# Patient Record
Sex: Male | Born: 1980 | Marital: Married | Smoking: Former smoker
Health system: Southern US, Community
[De-identification: ages and names within clinical notes are randomized; demographics above are authoritative.]

## PROBLEM LIST (undated history)

## (undated) DIAGNOSIS — Z946 Bone transplant status: Secondary | ICD-10-CM

## (undated) DIAGNOSIS — J40 Bronchitis, not specified as acute or chronic: Secondary | ICD-10-CM

## (undated) DIAGNOSIS — K859 Acute pancreatitis without necrosis or infection, unspecified: Secondary | ICD-10-CM

## (undated) DIAGNOSIS — D571 Sickle-cell disease without crisis: Secondary | ICD-10-CM

---

## 2000-12-12 ENCOUNTER — Encounter: Payer: Self-pay | Admitting: Emergency Medicine

## 2000-12-12 ENCOUNTER — Emergency Department (HOSPITAL_COMMUNITY): Admission: EM | Admit: 2000-12-12 | Discharge: 2000-12-12 | Payer: Self-pay | Admitting: Emergency Medicine

## 2001-06-15 ENCOUNTER — Emergency Department (HOSPITAL_COMMUNITY): Admission: EM | Admit: 2001-06-15 | Discharge: 2001-06-15 | Payer: Self-pay | Admitting: *Deleted

## 2002-12-05 ENCOUNTER — Emergency Department (HOSPITAL_COMMUNITY): Admission: EM | Admit: 2002-12-05 | Discharge: 2002-12-05 | Payer: Self-pay | Admitting: Emergency Medicine

## 2002-12-13 ENCOUNTER — Encounter: Payer: Self-pay | Admitting: Emergency Medicine

## 2002-12-13 ENCOUNTER — Emergency Department (HOSPITAL_COMMUNITY): Admission: EM | Admit: 2002-12-13 | Discharge: 2002-12-13 | Payer: Self-pay | Admitting: Emergency Medicine

## 2003-01-13 ENCOUNTER — Encounter: Payer: Self-pay | Admitting: *Deleted

## 2003-01-13 ENCOUNTER — Emergency Department (HOSPITAL_COMMUNITY): Admission: EM | Admit: 2003-01-13 | Discharge: 2003-01-13 | Payer: Self-pay | Admitting: Emergency Medicine

## 2003-11-13 ENCOUNTER — Emergency Department (HOSPITAL_COMMUNITY): Admission: EM | Admit: 2003-11-13 | Discharge: 2003-11-13 | Payer: Self-pay | Admitting: Emergency Medicine

## 2012-02-19 ENCOUNTER — Encounter (HOSPITAL_COMMUNITY): Payer: Self-pay

## 2012-02-19 ENCOUNTER — Emergency Department (INDEPENDENT_AMBULATORY_CARE_PROVIDER_SITE_OTHER)
Admission: EM | Admit: 2012-02-19 | Discharge: 2012-02-19 | Disposition: A | Discharge status: Home / Self Care | Payer: Self-pay | Source: Home / Self Care | Attending: Emergency Medicine | Admitting: Emergency Medicine

## 2012-02-19 DIAGNOSIS — J029 Acute pharyngitis, unspecified: Secondary | ICD-10-CM

## 2012-02-19 LAB — POCT RAPID STREP A: Streptococcus, Group A Screen (Direct): NEGATIVE

## 2012-02-19 MED ORDER — ACETAMINOPHEN-CODEINE 120-12 MG/5ML PO SUSP: 10.0000 mL | Freq: Four times a day (QID) | ORAL | Status: AC | PRN

## 2012-02-19 NOTE — ED Provider Notes (Signed)
History     CSN: [id number]  Arrival date & time 02/19/12  1704   First MD Initiated Contact with Patient 02/19/12 1709      Chief Complaint  Patient presents with  . Sore Throat    (Consider location/radiation/quality/duration/timing/severity/associated sxs/prior treatment) HPI Comments: Pt states that he has had no fever or other cold symptoms  Patient is a 31 y.o. male presenting with pharyngitis. The history is provided by the patient. No language interpreter was used.  Sore Throat This is a new problem. The current episode started yesterday. The problem occurs constantly. The problem has not changed since onset.Pertinent negatives include no headaches. The symptoms are aggravated by swallowing. Nothing relieves the symptoms. He has tried nothing for the symptoms.    History reviewed. No pertinent past medical history.  History reviewed. No pertinent past surgical history.  History reviewed. No pertinent family history.  History  Substance Use Topics  . Smoking status: Current Everyday Smoker  . Smokeless tobacco: Not on file  . Alcohol Use: No      Review of Systems  Constitutional: Negative.   HENT: Positive for sore throat.   Respiratory: Negative.   Cardiovascular: Negative.   Neurological: Negative for headaches.    Allergies  Eggs or egg-derived products; Iodine; and Peanut-containing drug products  Home Medications  No current outpatient prescriptions on file.  BP 127/70  Pulse 70  Temp 98.4 F (36.9 C) (Oral)  Resp 16  SpO2 100%  Physical Exam  Nursing note and vitals reviewed. Constitutional: He appears well-developed and well-nourished.  HENT:  Right Ear: External ear normal.  Left Ear: External ear normal.  Mouth/Throat: Posterior oropharyngeal erythema present. No oropharyngeal exudate.  Cardiovascular: Normal rate and regular rhythm.   Pulmonary/Chest: Effort normal and breath sounds normal.    ED Course  Procedures (including  critical care time)   Labs Reviewed  POCT RAPID STREP A ([facility] ONLY)   No results found.   1. Pharyngitis       MDM  Will treat symptomatically:don't think pt needs antibiotics at this time      Rhona Leavens, NP 02/19/12 1847

## 2012-02-19 NOTE — ED Provider Notes (Signed)
Medical screening examination/treatment/procedure(s) were performed by non-physician practitioner and as supervising physician I was immediately available for consultation/collaboration.  Haleem Hanner   Bonne Whack, MD 02/19/12 2027 

## 2012-02-19 NOTE — ED Notes (Signed)
Pt has sore throat that started yesterday, difficulty swallowing.

## 2012-07-19 ENCOUNTER — Emergency Department (HOSPITAL_COMMUNITY)
Admission: EM | Admit: 2012-07-19 | Discharge: 2012-07-20 | Disposition: A | Discharge status: Home / Self Care | Payer: Self-pay | Attending: Emergency Medicine | Admitting: Emergency Medicine

## 2012-07-19 ENCOUNTER — Emergency Department (HOSPITAL_COMMUNITY): Payer: Self-pay

## 2012-07-19 ENCOUNTER — Encounter (HOSPITAL_COMMUNITY): Payer: Self-pay | Admitting: Emergency Medicine

## 2012-07-19 DIAGNOSIS — R630 Anorexia: Secondary | ICD-10-CM | POA: Insufficient documentation

## 2012-07-19 DIAGNOSIS — R52 Pain, unspecified: Secondary | ICD-10-CM | POA: Insufficient documentation

## 2012-07-19 DIAGNOSIS — R0789 Other chest pain: Secondary | ICD-10-CM | POA: Insufficient documentation

## 2012-07-19 DIAGNOSIS — J9801 Acute bronchospasm: Secondary | ICD-10-CM | POA: Insufficient documentation

## 2012-07-19 DIAGNOSIS — F17200 Nicotine dependence, unspecified, uncomplicated: Secondary | ICD-10-CM | POA: Insufficient documentation

## 2012-07-19 DIAGNOSIS — R112 Nausea with vomiting, unspecified: Secondary | ICD-10-CM | POA: Insufficient documentation

## 2012-07-19 DIAGNOSIS — R509 Fever, unspecified: Secondary | ICD-10-CM | POA: Insufficient documentation

## 2012-07-19 DIAGNOSIS — R0602 Shortness of breath: Secondary | ICD-10-CM | POA: Insufficient documentation

## 2012-07-19 DIAGNOSIS — J029 Acute pharyngitis, unspecified: Secondary | ICD-10-CM | POA: Insufficient documentation

## 2012-07-19 DIAGNOSIS — D571 Sickle-cell disease without crisis: Secondary | ICD-10-CM | POA: Insufficient documentation

## 2012-07-19 DIAGNOSIS — J40 Bronchitis, not specified as acute or chronic: Secondary | ICD-10-CM | POA: Insufficient documentation

## 2012-07-19 DIAGNOSIS — R002 Palpitations: Secondary | ICD-10-CM | POA: Insufficient documentation

## 2012-07-19 DIAGNOSIS — R51 Headache: Secondary | ICD-10-CM | POA: Insufficient documentation

## 2012-07-19 HISTORY — DX: Sickle-cell disease without crisis: D57.1

## 2012-07-19 LAB — CBC WITH DIFFERENTIAL/PLATELET
Eosinophils Relative: 1 % (ref 0–5)
HCT: 40.5 % (ref 39.0–52.0)
Hemoglobin: 13.6 g/dL (ref 13.0–17.0)
Lymphocytes Relative: 5 % — ABNORMAL LOW (ref 12–46)
Lymphs Abs: 0.9 10*3/uL (ref 0.7–4.0)
MCV: 85.6 fL (ref 78.0–100.0)
Monocytes Absolute: 1.5 10*3/uL — ABNORMAL HIGH (ref 0.1–1.0)
Monocytes Relative: 9 % (ref 3–12)
Neutro Abs: 14.6 10*3/uL — ABNORMAL HIGH (ref 1.7–7.7)
WBC: 17.1 10*3/uL — ABNORMAL HIGH (ref 4.0–10.5)

## 2012-07-19 LAB — BASIC METABOLIC PANEL
BUN: 5 mg/dL — ABNORMAL LOW (ref 6–23)
CO2: 25 mEq/L (ref 19–32)
Calcium: 8.9 mg/dL (ref 8.4–10.5)
Chloride: 100 mEq/L (ref 96–112)
Creatinine, Ser: 0.97 mg/dL (ref 0.50–1.35)
Glucose, Bld: 120 mg/dL — ABNORMAL HIGH (ref 70–99)

## 2012-07-19 MED ORDER — IBUPROFEN 800 MG PO TABS
800.0000 mg | ORAL_TABLET | Freq: Once | ORAL | Status: AC
  Administered 2012-07-19: 800 mg via ORAL
  Filled 2012-07-19: qty 1

## 2012-07-19 MED ORDER — ALBUTEROL SULFATE (5 MG/ML) 0.5% IN NEBU
2.5000 mg | INHALATION_SOLUTION | Freq: Once | RESPIRATORY_TRACT | Status: AC
  Administered 2012-07-19: 2.5 mg via RESPIRATORY_TRACT
  Filled 2012-07-19: qty 0.5

## 2012-07-19 NOTE — ED Notes (Signed)
ZOX:[id number]<AV> Expected date:07/19/12<BR> Expected time: 9:10 PM<BR> Means of arrival:Ambulance<BR> Comments:<BR> Flu like sxs

## 2012-07-19 NOTE — ED Provider Notes (Signed)
History     CSN: [id number]  Arrival date & time 07/19/12  2126   First MD Initiated Contact with Patient 07/19/12 2216      Chief Complaint  Patient presents with  . Cough  . Nausea  . Emesis  . Pain    (Consider location/radiation/quality/duration/timing/severity/associated sxs/prior treatment) HPI Comments: 31 year old male presents to the emergency department complaining of not feeling well since Sunday. States he has a productive cough with dark green phlegm. Admits to associated fever of 101, chills, generalized body aches and decreased appetite, chest tightness, shortness of breath and sore throat. Mucinex and Benadryl provide some relief. Despite triage summary, patient denies nausea, vomiting or diarrhea. No sick contacts.  Patient is a 31 y.o. male presenting with cough and vomiting. The history is provided by the patient and a significant other.  Cough Associated symptoms include chills, headaches, sore throat and shortness of breath.  Emesis  Associated symptoms include arthralgias, chills, cough, a fever and headaches.    Past Medical History  Diagnosis Date  . Sickle cell anemia     History reviewed. No pertinent past surgical history.  History reviewed. No pertinent family history.  History  Substance Use Topics  . Smoking status: Current Every Day Smoker  . Smokeless tobacco: Not on file  . Alcohol Use: No      Review of Systems  Constitutional: Positive for fever, chills and appetite change.  HENT: Positive for sore throat.   Respiratory: Positive for cough, chest tightness and shortness of breath.   Cardiovascular: Positive for palpitations.  Gastrointestinal: Negative for nausea and vomiting.  Musculoskeletal: Positive for arthralgias.  Neurological: Positive for headaches.  All other systems reviewed and are negative.    Allergies  Eggs or egg-derived products; Iodine; and Peanut-containing drug products  Home Medications   Current  Outpatient Rx  Name  Route  Sig  Dispense  Refill  . DIPHENHYDRAMINE HCL (SLEEP) 25 MG PO TABS   Oral   Take 50 mg by mouth 2 (two) times daily as needed. allergies         . GUAIFENESIN 100 MG/5ML PO LIQD   Oral   Take 400 mg by mouth once.           BP 140/78  Temp 100.7 F (38.2 C) (Oral)  Resp 10  SpO2 100%  Physical Exam  Nursing note and vitals reviewed. Constitutional: He is oriented to person, place, and time. He appears well-developed and well-nourished. No distress.       Appears fatigued, laying in bed shivering.  HENT:  Head: Normocephalic and atraumatic.  Mouth/Throat: Uvula is midline and mucous membranes are normal. Posterior oropharyngeal erythema present. No posterior oropharyngeal edema.  Eyes: Conjunctivae normal are normal. No scleral icterus.  Neck: Normal range of motion. Neck supple.  Cardiovascular: Regular rhythm, normal heart sounds and intact distal pulses.  Tachycardia present.   Pulmonary/Chest: Effort normal. He has wheezes (scattered posterior lower lung fields). He has rhonchi (scattered posterior lower lung fields, R>L).  Abdominal: Soft. Bowel sounds are normal. There is generalized tenderness. There is no guarding.  Musculoskeletal: Normal range of motion. He exhibits no edema.  Lymphadenopathy:    He has no cervical adenopathy.  Neurological: He is alert and oriented to person, place, and time.  Skin: Skin is warm.       Very warm.  Psychiatric: He has a normal mood and affect. His behavior is normal.    ED Course  Procedures (including critical care  time)  Labs Reviewed  CBC WITH DIFFERENTIAL - Abnormal; Notable for the following:    WBC 17.1 (*)     Neutrophils Relative 85 (*)     Neutro Abs 14.6 (*)     Lymphocytes Relative 5 (*)     Monocytes Absolute 1.5 (*)     All other components within normal limits  BASIC METABOLIC PANEL - Abnormal; Notable for the following:    Sodium 134 (*)     Glucose, Bld 120 (*)     BUN 5 (*)      All other components within normal limits   Dg Chest 2 View  07/19/2012  *RADIOLOGY REPORT*  Clinical Data: Cough and nausea; sickle cell disease  CHEST - 2 VIEW  Comparison: None.  Findings: The lungs are clear.  Heart size and pulmonary vascularity are normal.  No adenopathy.  No bone lesions are appreciable.  IMPRESSION: No edema or consolidation.   Original Report Authenticated By: Bretta Bang, M.D.      1. Bronchitis   2. Bronchospasm       MDM  31 y/o male with productive cough, ronchi, wheezes, fever. CXR normal. Albuterol breathing treatment provided some relief for patient. Wheezing improved. Discharged with albuterol inhaler and zithromax for bronchitis/bronchospasm. Return precautions discussed. Infection care and precautions discussed. Stable for discharge.        Trevor Mace, PA-C 07/20/12 [phone]

## 2012-07-19 NOTE — ED Notes (Signed)
Pt c/oN/V/D x 3days with poor appetite generalized body aches non productive cough lungs clear

## 2012-07-20 MED ORDER — ALBUTEROL SULFATE HFA 108 (90 BASE) MCG/ACT IN AERS
2.0000 | INHALATION_SPRAY | Freq: Once | RESPIRATORY_TRACT | Status: AC
  Administered 2012-07-20: 2 via RESPIRATORY_TRACT
  Filled 2012-07-20: qty 6.7

## 2012-07-20 MED ORDER — AZITHROMYCIN 250 MG PO TABS
ORAL_TABLET | ORAL | Status: AC
  Filled 2012-07-20: qty 1

## 2012-07-20 MED ORDER — AZITHROMYCIN 250 MG PO TABS: 250.0000 mg | ORAL_TABLET | Freq: Every day | ORAL | Status: DC

## 2012-07-20 MED ORDER — AZITHROMYCIN 250 MG PO TABS
500.0000 mg | ORAL_TABLET | Freq: Once | ORAL | Status: AC
  Administered 2012-07-20: 500 mg via ORAL
  Filled 2012-07-20: qty 1

## 2012-07-21 ENCOUNTER — Emergency Department (HOSPITAL_COMMUNITY)
Admission: EM | Admit: 2012-07-21 | Discharge: 2012-07-21 | Disposition: A | Discharge status: Home / Self Care | Payer: Self-pay | Attending: Emergency Medicine | Admitting: Emergency Medicine

## 2012-07-21 ENCOUNTER — Encounter (HOSPITAL_COMMUNITY): Payer: Self-pay | Admitting: Emergency Medicine

## 2012-07-21 DIAGNOSIS — R630 Anorexia: Secondary | ICD-10-CM | POA: Insufficient documentation

## 2012-07-21 DIAGNOSIS — R1013 Epigastric pain: Secondary | ICD-10-CM | POA: Insufficient documentation

## 2012-07-21 DIAGNOSIS — D571 Sickle-cell disease without crisis: Secondary | ICD-10-CM | POA: Insufficient documentation

## 2012-07-21 DIAGNOSIS — R109 Unspecified abdominal pain: Secondary | ICD-10-CM

## 2012-07-21 DIAGNOSIS — Z8719 Personal history of other diseases of the digestive system: Secondary | ICD-10-CM | POA: Insufficient documentation

## 2012-07-21 DIAGNOSIS — Z79899 Other long term (current) drug therapy: Secondary | ICD-10-CM | POA: Insufficient documentation

## 2012-07-21 DIAGNOSIS — Z8709 Personal history of other diseases of the respiratory system: Secondary | ICD-10-CM | POA: Insufficient documentation

## 2012-07-21 HISTORY — DX: Bronchitis, not specified as acute or chronic: J40

## 2012-07-21 LAB — CBC WITH DIFFERENTIAL/PLATELET
Basophils Absolute: 0 K/uL (ref 0.0–0.1)
Basophils Relative: 0 % (ref 0–1)
Eosinophils Absolute: 0.1 K/uL (ref 0.0–0.7)
Eosinophils Relative: 1 % (ref 0–5)
HCT: 45.5 % (ref 39.0–52.0)
Hemoglobin: 15.3 g/dL (ref 13.0–17.0)
Lymphocytes Relative: 7 % — ABNORMAL LOW (ref 12–46)
Lymphs Abs: 0.8 K/uL (ref 0.7–4.0)
MCH: 29.2 pg (ref 26.0–34.0)
MCHC: 33.6 g/dL (ref 30.0–36.0)
MCV: 86.8 fL (ref 78.0–100.0)
Monocytes Absolute: 1.3 K/uL — ABNORMAL HIGH (ref 0.1–1.0)
Monocytes Relative: 11 % (ref 3–12)
Neutro Abs: 9.4 K/uL — ABNORMAL HIGH (ref 1.7–7.7)
Neutrophils Relative %: 81 % — ABNORMAL HIGH (ref 43–77)
Platelets: 243 K/uL (ref 150–400)
RBC: 5.24 MIL/uL (ref 4.22–5.81)
RDW: 13.8 % (ref 11.5–15.5)
WBC: 11.7 K/uL — ABNORMAL HIGH (ref 4.0–10.5)

## 2012-07-21 LAB — COMPREHENSIVE METABOLIC PANEL WITH GFR
ALT: 16 U/L (ref 0–53)
AST: 26 U/L (ref 0–37)
Albumin: 4.2 g/dL (ref 3.5–5.2)
Alkaline Phosphatase: 66 U/L (ref 39–117)
BUN: 11 mg/dL (ref 6–23)
CO2: 27 meq/L (ref 19–32)
Calcium: 9.7 mg/dL (ref 8.4–10.5)
Chloride: 99 meq/L (ref 96–112)
Creatinine, Ser: 1.04 mg/dL (ref 0.50–1.35)
GFR calc Af Amer: >90 mL/min (ref >90)
GFR calc non Af Amer: >90 mL/min (ref >90)
Glucose, Bld: 94 mg/dL (ref 70–99)
Potassium: 4.1 meq/L (ref 3.5–5.1)
Sodium: 138 meq/L (ref 135–145)
Total Bilirubin: 0.5 mg/dL (ref 0.3–1.2)
Total Protein: 8.5 g/dL — ABNORMAL HIGH (ref 6.0–8.3)

## 2012-07-21 MED ORDER — OXYCODONE-ACETAMINOPHEN 5-325 MG PO TABS: 1.0000 | ORAL_TABLET | Freq: Four times a day (QID) | ORAL | Status: DC | PRN

## 2012-07-21 MED ORDER — SODIUM CHLORIDE 0.9 % IV BOLUS (SEPSIS)
1000.0000 mL | Freq: Once | INTRAVENOUS | Status: AC
  Administered 2012-07-21: 1000 mL via INTRAVENOUS

## 2012-07-21 MED ORDER — HYDROMORPHONE HCL PF 1 MG/ML IJ SOLN
1.0000 mg | Freq: Once | INTRAMUSCULAR | Status: AC
  Administered 2012-07-21: 1 mg via INTRAVENOUS
  Filled 2012-07-21: qty 1

## 2012-07-21 MED ORDER — ONDANSETRON HCL 4 MG/2ML IJ SOLN
4.0000 mg | Freq: Once | INTRAMUSCULAR | Status: AC
  Administered 2012-07-21: 4 mg via INTRAVENOUS
  Filled 2012-07-21: qty 2

## 2012-07-21 MED ORDER — PROMETHAZINE HCL 25 MG PO TABS: 25.0000 mg | ORAL_TABLET | Freq: Four times a day (QID) | ORAL | Status: DC | PRN

## 2012-07-21 NOTE — ED Provider Notes (Signed)
History     CSN: [id number]  Arrival date & time 07/21/12  7829   First MD Initiated Contact with Patient 07/21/12 [phone]      Chief Complaint  Patient presents with  . Abdominal Pain    (Consider location/radiation/quality/duration/timing/severity/associated sxs/prior treatment) HPI... sharp epigastric pain for several days. Patient has history of pancreatitis. Has been drinking alcohol lately.  Decreased oral intake. Alcohol makes symptoms worse. Severity is moderate. No radiation of pain.  Past Medical History  Diagnosis Date  . Sickle cell anemia   . Bronchitis     History reviewed. No pertinent past surgical history.  History reviewed. No pertinent family history.  History  Substance Use Topics  . Smoking status: Never Smoker   . Smokeless tobacco: Not on file  . Alcohol Use: 7.2 oz/week    12 Cans of beer per week     Comment: Pint of liquor a day      Review of Systems  All other systems reviewed and are negative.    Allergies  Eggs or egg-derived products; Iodine; and Peanut-containing drug products  Home Medications   Current Outpatient Rx  Name  Route  Sig  Dispense  Refill  . ALBUTEROL SULFATE HFA 108 (90 BASE) MCG/ACT IN AERS   Inhalation   Inhale 2 puffs into the lungs every 6 (six) hours as needed. For shortness of breath         . AZITHROMYCIN 250 MG PO TABS   Oral   Take 250 mg by mouth daily.         Marland Kitchen DIPHENHYDRAMINE HCL (SLEEP) 25 MG PO TABS   Oral   Take 50 mg by mouth 2 (two) times daily as needed. allergies         . GUAIFENESIN ER 600 MG PO TB12   Oral   Take 1,200 mg by mouth 2 (two) times daily.         . GUAIFENESIN 100 MG/5ML PO LIQD   Oral   Take 400 mg by mouth once.         . OXYCODONE-ACETAMINOPHEN 5-325 MG PO TABS   Oral   Take 1-2 tablets by mouth every 6 (six) hours as needed for pain.   15 tablet   0   . PROMETHAZINE HCL 25 MG PO TABS   Oral   Take 1 tablet (25 mg total) by mouth every 6 (six)  hours as needed for nausea.   15 tablet   0     BP 116/69  Pulse 81  Temp 97.5 F (36.4 C) (Oral)  Resp 20  SpO2 100%  Physical Exam  Nursing note and vitals reviewed. Constitutional: He is oriented to person, place, and time. He appears well-developed and well-nourished.  HENT:  Head: Normocephalic and atraumatic.  Eyes: Conjunctivae normal and EOM are normal. Pupils are equal, round, and reactive to light.  Neck: Normal range of motion. Neck supple.  Cardiovascular: Normal rate, regular rhythm and normal heart sounds.   Pulmonary/Chest: Effort normal and breath sounds normal.  Abdominal: Soft. Bowel sounds are normal.       Minimal epigastric tenderness  Musculoskeletal: Normal range of motion.  Neurological: He is alert and oriented to person, place, and time.  Skin: Skin is warm and dry.  Psychiatric: He has a normal mood and affect.    ED Course  Procedures (including critical care time)  Labs Reviewed  CBC WITH DIFFERENTIAL - Abnormal; Notable for the following:    WBC  11.7 (*)     Neutrophils Relative 81 (*)     Neutro Abs 9.4 (*)     Lymphocytes Relative 7 (*)     Monocytes Absolute 1.3 (*)     All other components within normal limits  COMPREHENSIVE METABOLIC PANEL - Abnormal; Notable for the following:    Total Protein 8.5 (*)     All other components within normal limits  LIPASE, BLOOD   Dg Chest 2 View  07/19/2012  *RADIOLOGY REPORT*  Clinical Data: Cough and nausea; sickle cell disease  CHEST - 2 VIEW  Comparison: None.  Findings: The lungs are clear.  Heart size and pulmonary vascularity are normal.  No adenopathy.  No bone lesions are appreciable.  IMPRESSION: No edema or consolidation.   Original Report Authenticated By: Bretta Bang, M.D.      1. Abdominal pain       MDM  Lipase is normal. No acute abdomen on physical exam. patient feeling better after hydration and pain management.  Discharge home on Percocet #15 and Phenergan 25  mg#15        Donnetta Hutching, MD 07/21/12 [phone]

## 2012-07-21 NOTE — ED Notes (Addendum)
Pt c/o mid abd pain describes it as a squeezing feeling. Rates pain a 8/10. Pt states he went to [place] [facility] 2 days ago for the same complaint and was diagnosed with bronchitis. Pt denies n/v but states he has had diarrhea x 1 in last 24hrs. State he has been unable to eat solid food since Sunday. Pt was prescribed zithromax at [facility]. Pt states he has been drinking a pint of alcohol a day since he was released from jail on the 29th and drinking a 12 pack of beer.

## 2012-07-21 NOTE — ED Provider Notes (Signed)
Medical screening examination/treatment/procedure(s) were performed by non-physician practitioner and as supervising physician I was immediately available for consultation/collaboration.  Itha Kroeker T Halana Deisher, MD 07/21/12 1544 

## 2012-07-21 NOTE — ED Notes (Signed)
Pt states "I have pancreatitis".

## 2012-07-23 ENCOUNTER — Encounter (HOSPITAL_COMMUNITY): Payer: Self-pay | Admitting: *Deleted

## 2012-07-23 ENCOUNTER — Emergency Department (HOSPITAL_COMMUNITY): Payer: Self-pay

## 2012-07-23 ENCOUNTER — Emergency Department (HOSPITAL_COMMUNITY)
Admission: EM | Admit: 2012-07-23 | Discharge: 2012-07-23 | Disposition: A | Discharge status: Home / Self Care | Payer: Self-pay | Attending: Emergency Medicine | Admitting: Emergency Medicine

## 2012-07-23 DIAGNOSIS — K5900 Constipation, unspecified: Secondary | ICD-10-CM | POA: Insufficient documentation

## 2012-07-23 DIAGNOSIS — R110 Nausea: Secondary | ICD-10-CM | POA: Insufficient documentation

## 2012-07-23 DIAGNOSIS — D571 Sickle-cell disease without crisis: Secondary | ICD-10-CM | POA: Insufficient documentation

## 2012-07-23 DIAGNOSIS — R12 Heartburn: Secondary | ICD-10-CM | POA: Insufficient documentation

## 2012-07-23 DIAGNOSIS — K2970 Gastritis, unspecified, without bleeding: Secondary | ICD-10-CM | POA: Insufficient documentation

## 2012-07-23 DIAGNOSIS — Z79899 Other long term (current) drug therapy: Secondary | ICD-10-CM | POA: Insufficient documentation

## 2012-07-23 LAB — COMPREHENSIVE METABOLIC PANEL
ALT: 13 U/L (ref 0–53)
Albumin: 3.7 g/dL (ref 3.5–5.2)
Alkaline Phosphatase: 46 U/L (ref 39–117)
Chloride: 102 mEq/L (ref 96–112)
Potassium: 3.9 mEq/L (ref 3.5–5.1)
Sodium: 139 mEq/L (ref 135–145)
Total Protein: 7.4 g/dL (ref 6.0–8.3)

## 2012-07-23 MED ORDER — MORPHINE SULFATE 4 MG/ML IJ SOLN
4.0000 mg | Freq: Once | INTRAMUSCULAR | Status: AC
  Administered 2012-07-23: 4 mg via INTRAVENOUS
  Filled 2012-07-23: qty 1

## 2012-07-23 MED ORDER — PANTOPRAZOLE SODIUM 20 MG PO TBEC: 20.0000 mg | DELAYED_RELEASE_TABLET | Freq: Every day | ORAL | Status: DC

## 2012-07-23 MED ORDER — PANTOPRAZOLE SODIUM 40 MG IV SOLR
40.0000 mg | Freq: Once | INTRAVENOUS | Status: AC
  Administered 2012-07-23: 40 mg via INTRAVENOUS
  Filled 2012-07-23: qty 40

## 2012-07-23 MED ORDER — FAMOTIDINE 20 MG PO TABS: 20.0000 mg | ORAL_TABLET | Freq: Two times a day (BID) | ORAL | Status: DC

## 2012-07-23 MED ORDER — ONDANSETRON HCL 4 MG/2ML IJ SOLN
4.0000 mg | Freq: Once | INTRAMUSCULAR | Status: AC
  Administered 2012-07-23: 4 mg via INTRAVENOUS
  Filled 2012-07-23: qty 2

## 2012-07-23 MED ORDER — HYDROMORPHONE HCL PF 1 MG/ML IJ SOLN
1.0000 mg | Freq: Once | INTRAMUSCULAR | Status: AC
  Administered 2012-07-23: 1 mg via INTRAVENOUS
  Filled 2012-07-23: qty 1

## 2012-07-23 NOTE — ED Provider Notes (Signed)
Medical screening examination/treatment/procedure(s) were conducted as a shared visit with non-physician practitioner(s) and myself.  I personally evaluated the patient during the encounter   Joya Gaskins, MD 07/23/12 [phone]

## 2012-07-23 NOTE — ED Provider Notes (Signed)
History     CSN: [id number]  Arrival date & time 07/23/12  4098   First MD Initiated Contact with Patient 07/23/12 [phone]      Chief Complaint  Patient presents with  . Abdominal Pain    Patient is a 31 y.o. male presenting with abdominal pain. The history is provided by the patient.  Abdominal Pain The primary symptoms of the illness include abdominal pain and nausea. The primary symptoms of the illness do not include fever, fatigue, shortness of breath, vomiting, diarrhea, hematemesis, hematochezia or dysuria. The current episode started more than 2 days ago. The onset of the illness was sudden. The problem has been gradually worsening.  The illness is associated with alcohol use and eating. Additional symptoms associated with the illness include heartburn and constipation. Symptoms associated with the illness do not include back pain.  pt reports epigastric pain for over a week Reports it started after drinking ETOH but has not had ETOH in past several days Reports worse with eating No fever No cp/sob No dysuria, no groin/testicle pain reported No blood/dark stools Reports no BM in past 2 days  He reports he has sickle cell trait, not anemia  Past Medical History  Diagnosis Date  . Sickle cell anemia   . Bronchitis     History reviewed. No pertinent past surgical history.  History reviewed. No pertinent family history.  History  Substance Use Topics  . Smoking status: Never Smoker   . Smokeless tobacco: Not on file  . Alcohol Use: 7.2 oz/week    12 Cans of beer per week     Comment: Pint of liquor a day      Review of Systems  Constitutional: Negative for fever and fatigue.  Respiratory: Negative for shortness of breath.   Gastrointestinal: Positive for heartburn, nausea, abdominal pain and constipation. Negative for vomiting, diarrhea, hematochezia and hematemesis.  Genitourinary: Negative for dysuria.  Musculoskeletal: Negative for back pain.  All other  systems reviewed and are negative.    Allergies  Eggs or egg-derived products; Iodine; and Peanut-containing drug products  Home Medications   Current Outpatient Rx  Name  Route  Sig  Dispense  Refill  . ALBUTEROL SULFATE HFA 108 (90 BASE) MCG/ACT IN AERS   Inhalation   Inhale 2 puffs into the lungs every 6 (six) hours as needed. For shortness of breath         . AZITHROMYCIN 250 MG PO TABS   Oral   Take 250 mg by mouth daily.         Marland Kitchen DIPHENHYDRAMINE HCL (SLEEP) 25 MG PO TABS   Oral   Take 50 mg by mouth 2 (two) times daily as needed. allergies         . GUAIFENESIN ER 600 MG PO TB12   Oral   Take 1,200 mg by mouth 2 (two) times daily.         . GUAIFENESIN 100 MG/5ML PO LIQD   Oral   Take 400 mg by mouth once.         . OXYCODONE-ACETAMINOPHEN 5-325 MG PO TABS   Oral   Take 1-2 tablets by mouth every 6 (six) hours as needed. For pain         . PROMETHAZINE HCL 25 MG PO TABS   Oral   Take 25 mg by mouth every 6 (six) hours as needed. For nausea           BP 149/80  Pulse 54  Temp 99 F (37.2 C) (Oral)  Resp 18  SpO2 100%  Physical Exam CONSTITUTIONAL: Well developed/well nourished HEAD AND FACE: Normocephalic/atraumatic EYES: EOMI/PERRL, no scleral icterus noted ENMT: Mucous membranes moist NECK: supple no meningeal signs SPINE:entire spine nontender CV: S1/S2 noted, no murmurs/rubs/gallops noted LUNGS: Lungs are clear to auscultation bilaterally, no apparent distress ABDOMEN: soft, moderate epigastric tenderness, no rebound or guarding GU:no cva tenderness Rectal - stool color normal NEURO: Pt is awake/alert, moves all extremitiesx4 EXTREMITIES: pulses normal, full ROM SKIN: warm, color normal PSYCH: no abnormalities of mood noted  ED Course  Procedures    Labs Reviewed  COMPREHENSIVE METABOLIC PANEL  LIPASE, BLOOD   9:01 AM Pt with recent ED visits and evaluations, now with persistent abdominal pain.  He has tenderness in  epigastric region.  Will recheck lft/lipase and obtain US abdomen Pt agreeable Advised that if workup today is negative, he will be safe for discharge and f/u with GI I doubt appendicitis at this time Will need f/u with GI if [place] negative   MDM  Nursing notes including past medical history and social history reviewed and considered in documentation Previous records reviewed and considered Labs/vital reviewed and considered         Joya Gaskins, MD 07/23/12 1022

## 2012-07-23 NOTE — ED Notes (Signed)
Pt reports abd pain that occurs after he eats and food is digesting. Having constipation, last bm was two days ago.

## 2012-07-23 NOTE — ED Provider Notes (Signed)
  Physical Exam  BP 149/80  Pulse 54  Temp 99 F (37.2 C) (Oral)  Resp 18  SpO2 100%  Physical Exam  On examination he appeared uncomfortable but nontoxic. Vital signs as documented. Skin warm and dry and without overt rashes. Neck without JVD. Lungs clear. Heart exam notable for regular rhythm, normal sounds and absence of murmurs, rubs or gallops. Abdomen with epigastric tenderness on palpation without guarding or rebound tenderness. No hernia noted. No overlying skin changes. Negative Murphy's sign, no McBurney's point. Extremities nonedematous.  ED Course  Procedures  MDM  31 year old male presents with abdominal pain. He was moved to CDU while waiting for abdominal ultrasound. He has been evaluated by Dr. Bebe Shaggy. Per Dr. Bebe Shaggy, pt's pain likely reflect gastritis from alcohol use.  Alcohol cessation were discussed.  Will obtain abd [place].  If neg, treats for gastritis with PPI/H2.  F/u with GI.    11:35 AM abd US shows no acute abnormality.  Result were discussed with patient.  Pt continues to endorse epigastric abd pain, unrelieved with initial treatment.  Unable to tolerates PO at this time.  Will continue pain management.  Otherwise, VSS.    12:45 PM Pt able to tolerates PO. Still endorse epigastric pain.  I recommend close f/u with GI.  Will prescribe protonix and pepcid.   BP 149/80  Pulse 54  Temp 99 F (37.2 C) (Oral)  Resp 18  SpO2 100%  I have reviewed nursing notes and vital signs. I personally reviewed the imaging tests through PACS system  I reviewed available ER/hospitalization records thought the EMR   Fayrene Helper, [place] 07/23/12 1246

## 2013-08-22 ENCOUNTER — Encounter (HOSPITAL_COMMUNITY): Payer: Self-pay | Admitting: Emergency Medicine

## 2013-08-22 ENCOUNTER — Emergency Department (INDEPENDENT_AMBULATORY_CARE_PROVIDER_SITE_OTHER)
Admission: EM | Admit: 2013-08-22 | Discharge: 2013-08-22 | Disposition: A | Discharge status: Home / Self Care | Payer: Self-pay | Source: Home / Self Care

## 2013-08-22 DIAGNOSIS — X58XXXA Exposure to other specified factors, initial encounter: Secondary | ICD-10-CM

## 2013-08-22 DIAGNOSIS — S01311A Laceration without foreign body of right ear, initial encounter: Secondary | ICD-10-CM

## 2013-08-22 DIAGNOSIS — S01309A Unspecified open wound of unspecified ear, initial encounter: Secondary | ICD-10-CM

## 2013-08-22 DIAGNOSIS — Z23 Encounter for immunization: Secondary | ICD-10-CM

## 2013-08-22 MED ORDER — ACETAMINOPHEN-CODEINE #3 300-30 MG PO TABS: 1.0000 | ORAL_TABLET | Freq: Four times a day (QID) | ORAL | Status: DC | PRN

## 2013-08-22 MED ORDER — TRAMADOL HCL 50 MG PO TABS: 50.0000 mg | ORAL_TABLET | Freq: Four times a day (QID) | ORAL | Status: DC | PRN

## 2013-08-22 MED ORDER — TETANUS-DIPHTHERIA TOXOIDS TD 5-2 LFU IM INJ
0.5000 mL | INJECTION | Freq: Once | INTRAMUSCULAR | Status: AC
  Administered 2013-08-22: 0.5 mL via INTRAMUSCULAR

## 2013-08-22 MED ORDER — AMOXICILLIN-POT CLAVULANATE 875-125 MG PO TABS: 1.0000 | ORAL_TABLET | Freq: Two times a day (BID) | ORAL | Status: DC

## 2013-08-22 MED ORDER — TETANUS-DIPHTH-ACELL PERTUSSIS 5-2.5-18.5 LF-MCG/0.5 IM SUSP
INTRAMUSCULAR | Status: AC
  Filled 2013-08-22: qty 0.5

## 2013-08-22 NOTE — ED Notes (Signed)
C/o facial laceration States he got bite on the ear

## 2013-08-22 NOTE — Discharge Instructions (Signed)
Auricle Injuries You have an injury to your external ear (auricle). The ear has a layer of skin over cartilage. A cut or bruise to the ear can separate the skin from the cartilage underneath. This can cause problems with healing if blood gathers between the skin and the cartilage. Permanent damage to the ear may result if the excess blood is not drained within 1 to 2 days. Stitches, tape, or tissue glue may be used to close a cut. A pressure bandage may be used to keep blood from forming under the injured skin. If there is a lot of blood present (hematoma), a needle aspiration may be needed to remove it. You must have the ear checked within 1 to 2 days or as directed if you have had this type of injury. This is see if the blood has accumulated again. Call your caregiver for a follow-up exam as recommended.  SEEK IMMEDIATE MEDICAL CARE IF:  You develop severe pain.  You develop a fever or pus like drainage.  You have increased hearing loss or other problems. MAKE SURE YOU:   Understand these instructions.  Will watch your condition.  Will get help right away if you are not doing well or get worse. Document Released: 07/26/2005 Document Revised: 10/18/2011 Document Reviewed: 01/12/2007 [facility] Patient Information 2014 [place], [place].  Laceration Care, Adult A laceration is a cut or lesion that goes through all layers of the skin and into the tissue just beneath the skin. TREATMENT  Some lacerations may not require closure. Some lacerations may not be able to be closed due to an increased risk of infection. It is important to see your caregiver as soon as possible after an injury to minimize the risk of infection and maximize the opportunity for successful closure. If closure is appropriate, pain medicines may be given, if needed. The wound will be cleaned to help prevent infection. Your caregiver will use stitches (sutures), staples, wound glue (adhesive), or skin adhesive strips to repair  the laceration. These tools bring the skin edges together to allow for faster healing and a better cosmetic outcome. However, all wounds will heal with a scar. Once the wound has healed, scarring can be minimized by covering the wound with sunscreen during the day for 1 full year. HOME CARE INSTRUCTIONS  For sutures or staples:  Keep the wound clean and dry.  If you were given a bandage (dressing), you should change it at least once a day. Also, change the dressing if it becomes wet or dirty, or as directed by your caregiver.  Wash the wound with soap and water 2 times a day. Rinse the wound off with water to remove all soap. Pat the wound dry with a clean towel.  After cleaning, apply a thin layer of the antibiotic ointment as recommended by your caregiver. This will help prevent infection and keep the dressing from sticking.  You may shower as usual after the first 24 hours. Do not soak the wound in water until the sutures are removed.  Only take over-the-counter or prescription medicines for pain, discomfort, or fever as directed by your caregiver.  Get your sutures or staples removed as directed by your caregiver. For skin adhesive strips:  Keep the wound clean and dry.  Do not get the skin adhesive strips wet. You may bathe carefully, using caution to keep the wound dry.  If the wound gets wet, pat it dry with a clean towel.  Skin adhesive strips will fall off on their  own. You may trim the strips as the wound heals. Do not remove skin adhesive strips that are still stuck to the wound. They will fall off in time. For wound adhesive:  You may briefly wet your wound in the shower or bath. Do not soak or scrub the wound. Do not swim. Avoid periods of heavy perspiration until the skin adhesive has fallen off on its own. After showering or bathing, gently pat the wound dry with a clean towel.  Do not apply liquid medicine, cream medicine, or ointment medicine to your wound while the  skin adhesive is in place. This may loosen the film before your wound is healed.  If a dressing is placed over the wound, be careful not to apply tape directly over the skin adhesive. This may cause the adhesive to be pulled off before the wound is healed.  Avoid prolonged exposure to sunlight or tanning lamps while the skin adhesive is in place. Exposure to ultraviolet light in the first year will darken the scar.  The skin adhesive will usually remain in place for 5 to 10 days, then naturally fall off the skin. Do not pick at the adhesive film. You may need a tetanus shot if:  You cannot remember when you had your last tetanus shot.  You have never had a tetanus shot. If you get a tetanus shot, your arm may swell, get red, and feel warm to the touch. This is common and not a problem. If you need a tetanus shot and you choose not to have one, there is a rare chance of getting tetanus. Sickness from tetanus can be serious. SEEK MEDICAL CARE IF:   You have redness, swelling, or increasing pain in the wound.  You see a red line that goes away from the wound.  You have yellowish-white fluid (pus) coming from the wound.  You have a fever.  You notice a bad smell coming from the wound or dressing.  Your wound breaks open before or after sutures have been removed.  You notice something coming out of the wound such as wood or glass.  Your wound is on your hand or foot and you cannot move a finger or toe. SEEK IMMEDIATE MEDICAL CARE IF:   Your pain is not controlled with prescribed medicine.  You have severe swelling around the wound causing pain and numbness or a change in color in your arm, hand, leg, or foot.  Your wound splits open and starts bleeding.  You have worsening numbness, weakness, or loss of function of any joint around or beyond the wound.  You develop painful lumps near the wound or on the skin anywhere on your body. MAKE SURE YOU:   Understand these  instructions.  Will watch your condition.  Will get help right away if you are not doing well or get worse. Document Released: 07/26/2005 Document Revised: 10/18/2011 Document Reviewed: 01/19/2011 [facility] Patient Information 2014 [place], [place].  Sutured Wound Care Sutures are stitches that can be used to close wounds. Wound care helps prevent pain and infection.  HOME CARE INSTRUCTIONS   Rest and elevate the injured area until all the pain and swelling are gone.  Only take over-the-counter or prescription medicines for pain, discomfort, or fever as directed by your caregiver.  After 48 hours, gently wash the area with mild soap and water once a day, or as directed. Rinse off the soap. Pat the area dry with a clean towel. Do not rub the wound. This may  cause bleeding.  Follow your caregiver's instructions for how often to change the bandage (dressing). Stop using a dressing after 2 days or after the wound stops draining.  If the dressing sticks, moisten it with soapy water and gently remove it.  Apply ointment on the wound as directed.  Avoid stretching a sutured wound.  Drink enough fluids to keep your urine clear or pale yellow.  Follow up with your caregiver for suture removal as directed.  Use sunscreen on your wound for the next 3 to 6 months so the scar will not darken. SEEK IMMEDIATE MEDICAL CARE IF:   Your wound becomes red, swollen, hot, or tender.  You have increasing pain in the wound.  You have a red streak that extends from the wound.  There is pus coming from the wound.  You have a fever.  You have shaking chills.  There is a bad smell coming from the wound.  You have persistent bleeding from the wound. MAKE SURE YOU:   Understand these instructions.  Will watch your condition.  Will get help right away if you are not doing well or get worse. Document Released: 09/02/2004 Document Revised: 10/18/2011 Document Reviewed: 11/29/2010 [facility]  Patient Information 2014 [place].

## 2013-08-22 NOTE — ED Provider Notes (Signed)
Medical screening examination/treatment/procedure(s) were performed by non-physician practitioner and as supervising physician I was immediately available for consultation/collaboration.  Leslee Homeavid Surena Welge, M.D.  Reuben Likesavid C Mikaela Hilgeman, MD 08/22/13 [phone]
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
## 2017-05-26 ENCOUNTER — Encounter (HOSPITAL_COMMUNITY): Payer: Self-pay | Admitting: Emergency Medicine

## 2017-05-26 DIAGNOSIS — Z91010 Allergy to peanuts: Secondary | ICD-10-CM | POA: Insufficient documentation

## 2017-05-26 DIAGNOSIS — Z79899 Other long term (current) drug therapy: Secondary | ICD-10-CM | POA: Insufficient documentation

## 2017-05-26 DIAGNOSIS — D571 Sickle-cell disease without crisis: Secondary | ICD-10-CM | POA: Insufficient documentation

## 2017-05-26 DIAGNOSIS — J019 Acute sinusitis, unspecified: Secondary | ICD-10-CM | POA: Insufficient documentation

## 2017-05-26 NOTE — ED Triage Notes (Signed)
Pt reports cold symptoms for a month but no relief. Endorses headache and nasal congestion. Denies sore throat, N/V. Able to tolerate food but has loss of appetite.

## 2017-05-27 ENCOUNTER — Emergency Department (HOSPITAL_COMMUNITY)
Admission: EM | Admit: 2017-05-27 | Discharge: 2017-05-27 | Disposition: A | Discharge status: Home / Self Care | Payer: Self-pay | Attending: Emergency Medicine | Admitting: Emergency Medicine

## 2017-05-27 DIAGNOSIS — J019 Acute sinusitis, unspecified: Secondary | ICD-10-CM

## 2017-05-27 MED ORDER — BENZONATATE 100 MG PO CAPS: 100.0000 mg | ORAL_CAPSULE | Freq: Three times a day (TID) | ORAL | 0 refills | Status: DC | PRN

## 2017-05-27 MED ORDER — ALBUTEROL SULFATE HFA 108 (90 BASE) MCG/ACT IN AERS
2.0000 | INHALATION_SPRAY | Freq: Four times a day (QID) | RESPIRATORY_TRACT | Status: DC | PRN
Start: 2017-05-27 — End: 2017-05-27
  Administered 2017-05-27: 2 via RESPIRATORY_TRACT
  Filled 2017-05-27: qty 6.7

## 2017-05-27 MED ORDER — FLUTICASONE PROPIONATE 50 MCG/ACT NA SUSP
2.0000 | Freq: Every day | NASAL | Status: DC
  Administered 2017-05-27: 2 via NASAL
  Filled 2017-05-27: qty 16

## 2017-05-27 MED ORDER — AMOXICILLIN-POT CLAVULANATE 875-125 MG PO TABS: 1.0000 | ORAL_TABLET | Freq: Two times a day (BID) | ORAL | 0 refills | Status: DC

## 2017-05-27 MED ORDER — BENZONATATE 100 MG PO CAPS
200.0000 mg | ORAL_CAPSULE | Freq: Once | ORAL | Status: AC
  Administered 2017-05-27: 200 mg via ORAL
  Filled 2017-05-27: qty 2

## 2017-05-27 MED ORDER — IBUPROFEN 600 MG PO TABS: 600.0000 mg | ORAL_TABLET | Freq: Four times a day (QID) | ORAL | 0 refills | Status: DC | PRN

## 2017-05-27 MED ORDER — AMOXICILLIN-POT CLAVULANATE 875-125 MG PO TABS
1.0000 | ORAL_TABLET | Freq: Once | ORAL | Status: AC
  Administered 2017-05-27: 1 via ORAL
  Filled 2017-05-27: qty 1

## 2017-05-27 MED ORDER — NAPROXEN 500 MG PO TABS
500.0000 mg | ORAL_TABLET | Freq: Once | ORAL | Status: AC
  Administered 2017-05-27: 500 mg via ORAL
  Filled 2017-05-27: qty 1

## 2017-05-27 NOTE — ED Notes (Signed)
Pt ambulated to bathroom & back to room 

## 2017-05-27 NOTE — ED Provider Notes (Signed)
[facility] EMERGENCY DEPARTMENT Provider Note   CSN: [id number] Arrival date & time: 05/26/17  2216    History   Chief Complaint Chief Complaint  Patient presents with  . URI    HPI Dustin Franklin is a 36 y.o. male.  The history is provided by the patient. No language interpreter was used.  URI   This is a new problem. The current episode started more than 1 week ago. The problem has not changed since onset.Maximum temperature: subjective during 1st week of symptoms. Associated symptoms include diarrhea, congestion, rhinorrhea and cough. Pertinent negatives include no vomiting, no sore throat, no rash and no wheezing. Treatments tried: Alka Seltzer, Benadryl, Tylenol. The treatment provided no relief.    Past Medical History:  Diagnosis Date  . Bronchitis   . Sickle cell anemia (HCC)     There are no active problems to display for this patient.   History reviewed. No pertinent surgical history.    Home Medications    Prior to Admission medications   Medication Sig Start Date End Date Taking? Authorizing Provider  acetaminophen-codeine (TYLENOL #3) 300-30 MG per tablet Take 1-2 tablets by mouth every 6 (six) hours as needed for moderate pain. 08/22/13   Hayden Rasmussen, NP  albuterol (PROVENTIL HFA;VENTOLIN HFA) 108 (90 BASE) MCG/ACT inhaler Inhale 2 puffs into the lungs every 6 (six) hours as needed. For shortness of breath    [provider]  amoxicillin-clavulanate (AUGMENTIN) 875-125 MG tablet Take 1 tablet by mouth every 12 (twelve) hours. 05/27/17   Antony Madura, PA-C  azithromycin (ZITHROMAX) 250 MG tablet Take 250 mg by mouth daily. 07/20/12   Hess, Nada Boozer, PA-C  benzonatate (TESSALON) 100 MG capsule Take 1-2 capsules (100-200 mg total) by mouth 3 (three) times daily as needed for cough. 05/27/17   Antony Madura, PA-C  diphenhydrAMINE (SOMINEX) 25 MG tablet Take 50 mg by mouth 2 (two) times daily as needed. allergies    [provider]  famotidine (PEPCID) 20 MG tablet Take 1 tablet (20 mg total) by mouth 2 (two) times daily. 07/23/12   Fayrene Helper, PA-C  guaiFENesin (MUCINEX) 600 MG 12 hr tablet Take 1,200 mg by mouth 2 (two) times daily.    [provider]  guaiFENesin (ROBITUSSIN) 100 MG/5ML liquid Take 400 mg by mouth once.    [provider]  ibuprofen (ADVIL,MOTRIN) 600 MG tablet Take 1 tablet (600 mg total) by mouth every 6 (six) hours as needed. 05/27/17   Antony Madura, PA-C  oxyCODONE-acetaminophen (PERCOCET/ROXICET) 5-325 MG per tablet Take 1-2 tablets by mouth every 6 (six) hours as needed. For pain 07/21/12   Donnetta Hutching, MD  pantoprazole (PROTONIX) 20 MG tablet Take 1 tablet (20 mg total) by mouth daily. 07/23/12   Fayrene Helper, PA-C  promethazine (PHENERGAN) 25 MG tablet Take 25 mg by mouth every 6 (six) hours as needed. For nausea 07/21/12   Donnetta Hutching, MD    Family History No family history on file.  Social History Social History  Substance Use Topics  . Smoking status: Never Smoker  . Smokeless tobacco: Never Used  . Alcohol use 7.2 oz/week    12 Cans of beer per week     Comment: Pint of liquor a day     Allergies   Eggs or egg-derived products; Iodine; and Peanut-containing drug products   Review of Systems Review of Systems  HENT: Positive for congestion and rhinorrhea. Negative for sore throat.   Respiratory: Positive  for cough. Negative for wheezing.   Gastrointestinal: Positive for diarrhea. Negative for vomiting.  Skin: Negative for rash.  Ten systems reviewed and are negative for acute change, except as noted in the HPI.    Physical Exam Updated Vital Signs BP 126/75 (BP Location: Left Arm)   Pulse 64   Temp 98.4 F (36.9 C) (Oral)   Resp 18   SpO2 92%   Physical Exam  Constitutional: He is oriented to person, place, and time. He appears well-developed and well-nourished. No distress.  Nontoxic appearing and in NAD. Playing candy crush.    HENT:  Head: Normocephalic and atraumatic.  Audible nasal congestion. Patient tolerating secretions without difficulty. No tripoding or stridor.  Eyes: Conjunctivae and EOM are normal. No scleral icterus.  Neck: Normal range of motion.  No meningismus  Cardiovascular: Normal rate, regular rhythm and intact distal pulses.   Pulmonary/Chest: Effort normal. No respiratory distress. He has no wheezes. He has no rales.  Lungs CTAB. Respirations even and unlabored. Sporadic dry, nonproductive cough noted.  Musculoskeletal: Normal range of motion.  Neurological: He is alert and oriented to person, place, and time. He exhibits normal muscle tone. Coordination normal.  Ambulatory with steady gait.  Skin: Skin is warm and dry. No rash noted. He is not diaphoretic. No erythema. No pallor.  Psychiatric: He has a normal mood and affect. His behavior is normal.  Nursing note and vitals reviewed.    ED Treatments / Results  Labs (all labs ordered are listed, but only abnormal results are displayed) Labs Reviewed - No data to display  EKG  EKG Interpretation None       Radiology No results found.  Procedures Procedures (including critical care time)  Medications Ordered in ED Medications  fluticasone (FLONASE) 50 MCG/ACT nasal spray 2 spray (2 sprays Each Nare Given 05/27/17 0536)  albuterol (PROVENTIL HFA;VENTOLIN HFA) 108 (90 Base) MCG/ACT inhaler 2 puff (2 puffs Inhalation Given 05/27/17 0536)  amoxicillin-clavulanate (AUGMENTIN) 875-125 MG per tablet 1 tablet (1 tablet Oral Given 05/27/17 0534)  benzonatate (TESSALON) capsule 200 mg (200 mg Oral Given 05/27/17 0535)  naproxen (NAPROSYN) tablet 500 mg (500 mg Oral Given 05/27/17 0535)     Initial Impression / Assessment and Plan / ED Course  I have reviewed the triage vital signs and the nursing notes.  Pertinent labs & imaging results that were available during my care of the patient were reviewed by me and considered in my  medical decision making (see chart for details).      Patient complaining of moderate symptoms of nasal discharge/congestion with cough for ~1 month. Patient is afebrile with reassuring vital signs. No hypoxia, tachypnea, dyspnea; lungs CTAB. Doubt PNA. Given chronicity of symptoms and hx of sickle cell, will start on Augmentin in addition to symptomatic medications for treatment of sinusitis. Return precautions discussed and provided. Patient discharged in stable condition with no unaddressed concerns.   Final Clinical Impressions(s) / ED Diagnoses   Final diagnoses:  Acute sinusitis, recurrence not specified, unspecified location    New Prescriptions New Prescriptions   AMOXICILLIN-CLAVULANATE (AUGMENTIN) 875-125 MG TABLET    Take 1 tablet by mouth every 12 (twelve) hours.   BENZONATATE (TESSALON) 100 MG CAPSULE    Take 1-2 capsules (100-200 mg total) by mouth 3 (three) times daily as needed for cough.   IBUPROFEN (ADVIL,MOTRIN) 600 MG TABLET    Take 1 tablet (600 mg total) by mouth every 6 (six) hours as needed.     Jobe Gibbon,  Tresa EndoKelly, PA-C 05/27/17 0554    Dione BoozeGlick, David, MD 05/27/17 [phone]

## 2017-05-27 NOTE — Discharge Instructions (Signed)
Take Augmentin as prescribed until finished. You may use 2 sprays of Flonase in each nostril daily for congestion. Used 2 puffs of an albuterol inhaler every 6 hours as needed for shortness of breath and cough. You may also take Tessalon for cough as prescribed. Take ibuprofen for discomfort/pain. Follow-up with a primary care doctor.

## 2017-05-27 NOTE — ED Notes (Signed)
PA at bedside.

## 2017-05-27 NOTE — ED Notes (Signed)
Pt. alert & interactive during discharge; pt. ambulatory to exit 

## 2017-07-04 ENCOUNTER — Encounter (HOSPITAL_COMMUNITY): Payer: Self-pay

## 2017-07-04 ENCOUNTER — Emergency Department (HOSPITAL_COMMUNITY)
Admission: EM | Admit: 2017-07-04 | Discharge: 2017-07-04 | Disposition: A | Discharge status: Home / Self Care | Payer: Self-pay | Attending: Physician Assistant | Admitting: Physician Assistant

## 2017-07-04 ENCOUNTER — Other Ambulatory Visit: Payer: Self-pay

## 2017-07-04 ENCOUNTER — Emergency Department (HOSPITAL_COMMUNITY): Payer: Self-pay

## 2017-07-04 DIAGNOSIS — Z79899 Other long term (current) drug therapy: Secondary | ICD-10-CM | POA: Insufficient documentation

## 2017-07-04 DIAGNOSIS — M545 Low back pain, unspecified: Secondary | ICD-10-CM

## 2017-07-04 DIAGNOSIS — Z91010 Allergy to peanuts: Secondary | ICD-10-CM | POA: Insufficient documentation

## 2017-07-04 HISTORY — DX: Acute pancreatitis without necrosis or infection, unspecified: K85.90

## 2017-07-04 LAB — URINALYSIS, ROUTINE W REFLEX MICROSCOPIC
BILIRUBIN URINE: NEGATIVE
Glucose, UA: NEGATIVE mg/dL
Hgb urine dipstick: NEGATIVE
KETONES UR: NEGATIVE mg/dL
LEUKOCYTES UA: NEGATIVE
Nitrite: NEGATIVE
PH: 7 (ref 5.0–8.0)
Protein, ur: NEGATIVE mg/dL
Specific Gravity, Urine: 1.018 (ref 1.005–1.030)

## 2017-07-04 LAB — CBC
HCT: 44.8 % (ref 39.0–52.0)
HEMOGLOBIN: 15.1 g/dL (ref 13.0–17.0)
MCH: 29.7 pg (ref 26.0–34.0)
MCHC: 33.7 g/dL (ref 30.0–36.0)
MCV: 88.2 fL (ref 78.0–100.0)
Platelets: 195 10*3/uL (ref 150–400)
RBC: 5.08 MIL/uL (ref 4.22–5.81)
RDW: 13.9 % (ref 11.5–15.5)
WBC: 6.4 10*3/uL (ref 4.0–10.5)

## 2017-07-04 LAB — BASIC METABOLIC PANEL
ANION GAP: 6 (ref 5–15)
BUN: 9 mg/dL (ref 6–20)
CALCIUM: 8.8 mg/dL — AB (ref 8.9–10.3)
CO2: 25 mmol/L (ref 22–32)
Chloride: 108 mmol/L (ref 101–111)
Creatinine, Ser: 0.84 mg/dL (ref 0.61–1.24)
GFR calc Af Amer: >60 mL/min (ref >60)
GFR calc non Af Amer: >60 mL/min (ref >60)
GLUCOSE: 97 mg/dL (ref 65–99)
Potassium: 4.3 mmol/L (ref 3.5–5.1)
Sodium: 139 mmol/L (ref 135–145)

## 2017-07-04 MED ORDER — HYDROCODONE-ACETAMINOPHEN 5-325 MG PO TABS: 1.0000 | ORAL_TABLET | Freq: Four times a day (QID) | ORAL | 0 refills | Status: DC | PRN

## 2017-07-04 MED ORDER — METHOCARBAMOL 500 MG PO TABS: 500.0000 mg | ORAL_TABLET | Freq: Two times a day (BID) | ORAL | 0 refills | Status: DC

## 2017-07-04 MED ORDER — HYDROCODONE-ACETAMINOPHEN 5-325 MG PO TABS
2.0000 | ORAL_TABLET | Freq: Once | ORAL | Status: AC
  Administered 2017-07-04: 2 via ORAL
  Filled 2017-07-04: qty 2

## 2017-07-04 MED ORDER — IBUPROFEN 600 MG PO TABS: 600.0000 mg | ORAL_TABLET | Freq: Four times a day (QID) | ORAL | 0 refills | Status: DC | PRN

## 2017-07-04 MED ORDER — KETOROLAC TROMETHAMINE 60 MG/2ML IM SOLN
60.0000 mg | Freq: Once | INTRAMUSCULAR | Status: AC
  Administered 2017-07-04: 60 mg via INTRAMUSCULAR
  Filled 2017-07-04: qty 2

## 2017-07-04 MED ORDER — LIDOCAINE 5 % EX PTCH
1.0000 | MEDICATED_PATCH | Freq: Once | CUTANEOUS | Status: DC
  Administered 2017-07-04: 1 via TRANSDERMAL
  Filled 2017-07-04: qty 1

## 2017-07-04 MED ORDER — LIDOCAINE 5 % EX PTCH: 1.0000 | MEDICATED_PATCH | CUTANEOUS | 0 refills | Status: DC

## 2017-07-04 MED ORDER — METHOCARBAMOL 500 MG PO TABS
1000.0000 mg | ORAL_TABLET | Freq: Once | ORAL | Status: AC
  Administered 2017-07-04: 1000 mg via ORAL
  Filled 2017-07-04: qty 2

## 2017-07-04 NOTE — ED Provider Notes (Signed)
[facility]   CSN: [id number] Arrival date & time: 07/04/17  [id number]     History   Chief Complaint Chief Complaint  Patient presents with  . Flank Pain    HPI Dustin Franklin is a 36 y.o. male.  HPI  Dustin Franklin is a 36 y.o. male, with a history of pancreatitis and sickle cell, presenting to the ED with left lower back pain that came on yesterday. Twisted getting out of the car and began to have this pain.  Pain is sharp/sore, 10/10, nonradiating.  Denies fever/chills, abdominal pain, N/V/C/D, pain with BMs, dysuria, hematuria, SOB, CP, neuro deficits, or any other complaints.   Past Medical History:  Diagnosis Date  . Bronchitis   . Pancreatitis   . Sickle cell anemia (HCC)     There are no active problems to display for this patient.   History reviewed. No pertinent surgical history.     Home Medications    Prior to Admission medications   Medication Sig Start Date End Date Taking? Authorizing Provider  acetaminophen (TYLENOL) 325 MG tablet Take 650 mg by mouth every 6 (six) hours as needed for mild pain.   Yes [provider]  albuterol (PROVENTIL HFA;VENTOLIN HFA) 108 (90 BASE) MCG/ACT inhaler Inhale 2 puffs into the lungs every 6 (six) hours as needed. For shortness of breath   Yes [provider]  ferrous sulfate 325 (65 FE) MG tablet Take 325 mg by mouth daily with breakfast.   Yes [provider]  folic acid (FOLVITE) 1 MG tablet Take 1 mg by mouth daily.   Yes [provider]  hydroxyurea (HYDREA) 500 MG capsule Take 500 mg by mouth daily. May take with food to minimize GI side effects.   Yes [provider]  ibuprofen (ADVIL,MOTRIN) 600 MG tablet Take 1 tablet (600 mg total) by mouth every 6 (six) hours as needed. 05/27/17  Yes Antony MaduraHumes, Kelly, PA-C  acetaminophen-codeine (TYLENOL #3) 300-30 MG per tablet Take 1-2 tablets by mouth every 6 (six) hours as  needed for moderate pain. Patient not taking: Reported on 07/04/2017 08/22/13   Hayden RasmussenMabe, David, NP  amoxicillin-clavulanate (AUGMENTIN) 875-125 MG tablet Take 1 tablet by mouth every 12 (twelve) hours. Patient not taking: Reported on 07/04/2017 05/27/17   Antony MaduraHumes, Kelly, PA-C  benzonatate (TESSALON) 100 MG capsule Take 1-2 capsules (100-200 mg total) by mouth 3 (three) times daily as needed for cough. Patient not taking: Reported on 07/04/2017 05/27/17   Antony MaduraHumes, Kelly, PA-C  famotidine (PEPCID) 20 MG tablet Take 1 tablet (20 mg total) by mouth 2 (two) times daily. Patient not taking: Reported on 07/04/2017 07/23/12   Fayrene Helperran, Bowie, PA-C  HYDROcodone-acetaminophen (NORCO/VICODIN) 5-325 MG tablet Take 1-2 tablets by mouth every 6 (six) hours as needed for severe pain. 07/04/17   Joy, Shawn C, PA-C  ibuprofen (ADVIL,MOTRIN) 600 MG tablet Take 1 tablet (600 mg total) by mouth every 6 (six) hours as needed. 07/04/17   Joy, Shawn C, PA-C  lidocaine (LIDODERM) 5 % Place 1 patch onto the skin daily. Remove & Discard patch within 12 hours or as directed by MD 07/04/17   Joy, Shawn C, PA-C  methocarbamol (ROBAXIN) 500 MG tablet Take 1 tablet (500 mg total) by mouth 2 (two) times daily. 07/04/17   Joy, Shawn C, PA-C  pantoprazole (PROTONIX) 20 MG tablet Take 1 tablet (20 mg total) by mouth daily. Patient not taking: Reported on 07/04/2017 07/23/12   Laveda Normanran,  Kelton Pillar    Family History No family history on file.  Social History Social History   Tobacco Use  . Smoking status: Never Smoker  . Smokeless tobacco: Never Used  Substance Use Topics  . Alcohol use: Yes    Alcohol/week: 7.2 oz    Types: 12 Cans of beer per week    Comment: Pint of liquor a day  . Drug use: Yes    Types: Marijuana     Allergies   Eggs or egg-derived products; Iodine; and Peanut-containing drug products   Review of Systems Review of Systems  Constitutional: Negative for chills, diaphoresis and fever.  Respiratory: Negative  for shortness of breath.   Cardiovascular: Negative for chest pain.  Gastrointestinal: Negative for abdominal pain, blood in stool, constipation, diarrhea, nausea and vomiting.  Genitourinary: Negative for discharge, dysuria, frequency, hematuria and urgency.  Musculoskeletal: Positive for back pain.  Neurological: Negative for dizziness, weakness, light-headedness, numbness and headaches.  All other systems reviewed and are negative.    Physical Exam Updated Vital Signs BP 130/79 (BP Location: Right Arm)   Pulse 85   Temp 98.1 F (36.7 C) (Oral)   Resp 16   Ht 5\' 7"  (1.702 m)   Wt 72.6 kg (160 lb)   SpO2 98%   BMI 25.06 kg/m   Physical Exam  Constitutional: He appears well-developed and well-nourished. No distress.  HENT:  Head: Normocephalic and atraumatic.  Eyes: Conjunctivae are normal.  Neck: Neck supple.  Cardiovascular: Normal rate, regular rhythm, normal heart sounds and intact distal pulses.  Pulmonary/Chest: Effort normal and breath sounds normal. No respiratory distress.  Abdominal: Soft. There is no tenderness. There is no guarding.  Musculoskeletal: He exhibits no edema.       Arms: Lymphadenopathy:    He has no cervical adenopathy.  Neurological: He is alert.  No noted acute sensory deficits. Strength 5/5 with flexion and extension at the bilateral hips, knees, and ankles. No noted gait deficit. Coordination intact with heel to shin testing.  Skin: Skin is warm and dry. Capillary refill takes less than 2 seconds. He is not diaphoretic.  Psychiatric: He has a normal mood and affect. His behavior is normal.  Nursing note and vitals reviewed.    ED Treatments / Results  Labs (all labs ordered are listed, but only abnormal results are displayed) Labs Reviewed  BASIC METABOLIC PANEL - Abnormal; Notable for the following components:      Result Value   Calcium 8.8 (*)    All other components within normal limits  URINALYSIS, ROUTINE W REFLEX MICROSCOPIC    CBC    EKG  EKG Interpretation None       Radiology Dg Hip Unilat W Or Wo Pelvis 2-3 Views Left  Result Date: 07/04/2017 CLINICAL DATA:  Left hip pain. EXAM: DG HIP (WITH OR WITHOUT PELVIS) 2-3V LEFT COMPARISON:  None. FINDINGS: There is no evidence of hip fracture or dislocation. There is no evidence of arthropathy or other focal bone abnormality. IMPRESSION: No acute osseous injury of the left hip. Electronically Signed   By: Elige Ko   On: 07/04/2017 13:44    Procedures Procedures (including critical care time)  Medications Ordered in ED Medications  lidocaine (LIDODERM) 5 % 1 patch (1 patch Transdermal Patch Applied 07/04/17 1249)  HYDROcodone-acetaminophen (NORCO/VICODIN) 5-325 MG per tablet 2 tablet (not administered)  ketorolac (TORADOL) injection 60 mg (60 mg Intramuscular Given 07/04/17 1249)  methocarbamol (ROBAXIN) tablet 1,000 mg (1,000 mg Oral Given 07/04/17  1248)     Initial Impression / Assessment and Plan / ED Course  I have reviewed the triage vital signs and the nursing notes.  Pertinent labs & imaging results that were available during my care of the patient were reviewed by me and considered in my medical decision making (see chart for details).      Patient presents with left lower back pain, reproducible on exam.  No noted neuro or functional deficits.  No red flag symptoms.  No acute abnormalities on lab results.  Low suspicion for intra-abdominal pathology.  No acute abnormality on imaging.  PCP follow-up.  Resources given. The patient was given instructions for home care as well as return precautions. Patient voices understanding of these instructions, accepts the plan, and is comfortable with discharge.  Vitals:   07/04/17 1200 07/04/17 1215 07/04/17 1230 07/04/17 1245  BP: 108/88 126/78 131/82 129/84  Pulse: (!) 39 (!) 54 (!) 53 (!) 58  Resp:      Temp:      TempSrc:      SpO2: 94% 95% 95% 97%  Weight:      Height:         Final  Clinical Impressions(s) / ED Diagnoses   Final diagnoses:  Acute left-sided low back pain without sciatica    ED Discharge Orders        Ordered    ibuprofen (ADVIL,MOTRIN) 600 MG tablet  Every 6 hours PRN     07/04/17 1144    methocarbamol (ROBAXIN) 500 MG tablet  2 times daily     07/04/17 1144    lidocaine (LIDODERM) 5 %  Every 24 hours     07/04/17 1144    HYDROcodone-acetaminophen (NORCO/VICODIN) 5-325 MG tablet  Every 6 hours PRN     07/04/17 1411       Anselm PancoastJoy, Shawn C, PA-C 07/04/17 1412    Abelino DerrickMackuen, Courteney Lyn, MD 07/05/17 [phone]

## 2017-07-04 NOTE — Discharge Instructions (Addendum)
Expect your soreness to increase over the next 2-3 days. Take it easy, but do not lay around too much as this may make any stiffness worse.  °Antiinflammatory medications: Take 600 mg of ibuprofen every 6 hours or 440 mg (over the counter dose) to 500 mg (prescription dose) of naproxen every 12 hours for the next 3 days. After this time, these medications may be used as needed for pain. Take these medications with food to avoid upset stomach. Choose only one of these medications, do not take them together.  °Tylenol: Should you continue to have additional pain while taking the ibuprofen or naproxen, you may add in tylenol as needed. Your daily total maximum amount of tylenol from all sources should be limited to 4000mg/day for persons without liver problems, or 2000mg/day for those with liver problems. °Vicodin: May take Vicodin as needed for severe pain.  Do not drive or perform other dangerous activities while taking the Vicodin.  Please note that each pill of Vicodin contains 325 mg of Tylenol and the above dosage limits apply. °Muscle relaxer: Robaxin is a muscle relaxer and may help loosen stiff muscles. Do not take the Robaxin while driving or performing other dangerous activities.  °Lidocaine patches: These are available via either prescription or over-the-counter. The over-the-counter option may be more economical one and are likely just as effective. There are multiple over-the-counter brands, such as Salonpas. °Exercises: Be sure to perform the attached exercises starting with three times a week and working up to performing them daily. This is an essential part of preventing long term problems.  ° °Follow up with a primary care provider for any future management of these complaints. °

## 2017-07-04 NOTE — ED Triage Notes (Signed)
Per Pt, Pt is coming from home with complaints of left sided flank pain that started yesterday. Denies burning with urination or blood in urine.

## 2017-07-16 ENCOUNTER — Emergency Department (HOSPITAL_COMMUNITY)
Admission: EM | Admit: 2017-07-16 | Discharge: 2017-07-16 | Disposition: A | Discharge status: Home / Self Care | Payer: Self-pay | Attending: Emergency Medicine | Admitting: Emergency Medicine

## 2017-07-16 ENCOUNTER — Emergency Department (HOSPITAL_COMMUNITY): Payer: Self-pay

## 2017-07-16 ENCOUNTER — Other Ambulatory Visit: Payer: Self-pay

## 2017-07-16 ENCOUNTER — Encounter (HOSPITAL_COMMUNITY): Payer: Self-pay

## 2017-07-16 DIAGNOSIS — M545 Low back pain, unspecified: Secondary | ICD-10-CM

## 2017-07-16 DIAGNOSIS — Z79899 Other long term (current) drug therapy: Secondary | ICD-10-CM | POA: Insufficient documentation

## 2017-07-16 DIAGNOSIS — Z91010 Allergy to peanuts: Secondary | ICD-10-CM | POA: Insufficient documentation

## 2017-07-16 DIAGNOSIS — R1032 Left lower quadrant pain: Secondary | ICD-10-CM | POA: Insufficient documentation

## 2017-07-16 LAB — CBC WITH DIFFERENTIAL/PLATELET
BASOS ABS: 0 10*3/uL (ref 0.0–0.1)
Basophils Relative: 0 %
EOS PCT: 5 %
Eosinophils Absolute: 0.5 10*3/uL (ref 0.0–0.7)
HCT: 43.4 % (ref 39.0–52.0)
Hemoglobin: 14.6 g/dL (ref 13.0–17.0)
LYMPHS ABS: 1.8 10*3/uL (ref 0.7–4.0)
LYMPHS PCT: 18 %
MCH: 29.4 pg (ref 26.0–34.0)
MCHC: 33.6 g/dL (ref 30.0–36.0)
MCV: 87.3 fL (ref 78.0–100.0)
MONO ABS: 0.7 10*3/uL (ref 0.1–1.0)
Monocytes Relative: 7 %
Neutro Abs: 6.9 10*3/uL (ref 1.7–7.7)
Neutrophils Relative %: 70 %
PLATELETS: 212 10*3/uL (ref 150–400)
RBC: 4.97 MIL/uL (ref 4.22–5.81)
RDW: 14 % (ref 11.5–15.5)
WBC: 9.8 10*3/uL (ref 4.0–10.5)

## 2017-07-16 LAB — BASIC METABOLIC PANEL
Anion gap: 6 (ref 5–15)
BUN: 9 mg/dL (ref 6–20)
CALCIUM: 9 mg/dL (ref 8.9–10.3)
CO2: 26 mmol/L (ref 22–32)
CREATININE: 0.92 mg/dL (ref 0.61–1.24)
Chloride: 106 mmol/L (ref 101–111)
GFR calc Af Amer: >60 mL/min (ref >60)
GLUCOSE: 122 mg/dL — AB (ref 65–99)
Potassium: 3.6 mmol/L (ref 3.5–5.1)
Sodium: 138 mmol/L (ref 135–145)

## 2017-07-16 MED ORDER — SODIUM CHLORIDE 0.9 % IV SOLN
INTRAVENOUS | Status: DC
  Administered 2017-07-16: 21:00:00 via INTRAVENOUS

## 2017-07-16 MED ORDER — HYDROCODONE-ACETAMINOPHEN 5-325 MG PO TABS: 1.0000 | ORAL_TABLET | Freq: Four times a day (QID) | ORAL | 0 refills | Status: DC | PRN

## 2017-07-16 MED ORDER — KETOROLAC TROMETHAMINE 30 MG/ML IJ SOLN
30.0000 mg | Freq: Once | INTRAMUSCULAR | Status: AC
  Administered 2017-07-16: 30 mg via INTRAVENOUS
  Filled 2017-07-16: qty 1

## 2017-07-16 MED ORDER — NAPROXEN 500 MG PO TABS: 500.0000 mg | ORAL_TABLET | Freq: Two times a day (BID) | ORAL | 0 refills | Status: DC

## 2017-07-16 MED ORDER — HYDROMORPHONE HCL 1 MG/ML IJ SOLN
1.0000 mg | Freq: Once | INTRAMUSCULAR | Status: AC
  Administered 2017-07-16: 1 mg via INTRAVENOUS
  Filled 2017-07-16: qty 1

## 2017-07-16 MED ORDER — SODIUM CHLORIDE 0.9 % IV BOLUS (SEPSIS)
500.0000 mL | Freq: Once | INTRAVENOUS | Status: AC
  Administered 2017-07-16: 500 mL via INTRAVENOUS

## 2017-07-16 MED ORDER — DIAZEPAM 5 MG PO TABS: 5.0000 mg | ORAL_TABLET | Freq: Two times a day (BID) | ORAL | 0 refills | Status: DC

## 2017-07-16 MED ORDER — ONDANSETRON HCL 4 MG/2ML IJ SOLN
4.0000 mg | Freq: Once | INTRAMUSCULAR | Status: AC
  Administered 2017-07-16: 4 mg via INTRAVENOUS
  Filled 2017-07-16: qty 2

## 2017-07-16 MED ORDER — DIAZEPAM 5 MG PO TABS
5.0000 mg | ORAL_TABLET | Freq: Once | ORAL | Status: AC
  Administered 2017-07-16: 5 mg via ORAL
  Filled 2017-07-16: qty 1

## 2017-07-16 NOTE — ED Notes (Signed)
Patient transported to CT 

## 2017-07-16 NOTE — Discharge Instructions (Signed)
Work note provided.  Take the Naprosyn on a regular basis.  Take the Valium along with it.  Take the hydrocodone as needed to help with additional pain relief.  Rest as much as possible over the next 2 days.  Return for any new or worse symptoms.

## 2017-07-16 NOTE — ED Provider Notes (Addendum)
[facility]   CSN: [id number] Arrival date & time: 07/16/17  1833     History   Chief Complaint Chief Complaint  Patient presents with  . Back Pain    HPI Dustin Franklin is a 36 y.o. male.  Patient presenting with a complaint of back pain.  Patient with left lower lumbar back pain.  Patient seen for the same on November 26.  That workup was for acute left-sided low back pain without sciatica.  Patient was discharged home with pain medicine.  Patient had an acute exacerbation of the same pain today.  Difficulty walking difficulty straightening out.  Denies any numbness or weakness to his legs.  The pain does not shoot down into the legs.  Pain is localized just to the left lower back.  At the previous visit patient had x-ray of his left hip and pelvis.  Which were negative.      Past Medical History:  Diagnosis Date  . Bronchitis   . Pancreatitis   . Sickle cell anemia (HCC)     There are no active problems to display for this patient.   History reviewed. No pertinent surgical history.     Home Medications    Prior to Admission medications   Medication Sig Start Date End Date Taking? Authorizing Provider  acetaminophen (TYLENOL) 325 MG tablet Take 650 mg by mouth every 6 (six) hours as needed for mild pain.    [provider]  acetaminophen-codeine (TYLENOL #3) 300-30 MG per tablet Take 1-2 tablets by mouth every 6 (six) hours as needed for moderate pain. Patient not taking: Reported on 07/04/2017 08/22/13   Hayden RasmussenMabe, David, NP  albuterol (PROVENTIL HFA;VENTOLIN HFA) 108 (90 BASE) MCG/ACT inhaler Inhale 2 puffs into the lungs every 6 (six) hours as needed. For shortness of breath    [provider]  amoxicillin-clavulanate (AUGMENTIN) 875-125 MG tablet Take 1 tablet by mouth every 12 (twelve) hours. Patient not taking: Reported on 07/04/2017 05/27/17   Antony MaduraHumes, Kelly, PA-C  benzonatate (TESSALON) 100  MG capsule Take 1-2 capsules (100-200 mg total) by mouth 3 (three) times daily as needed for cough. Patient not taking: Reported on 07/04/2017 05/27/17   Antony MaduraHumes, Kelly, PA-C  diazepam (VALIUM) 5 MG tablet Take 1 tablet (5 mg total) by mouth 2 (two) times daily. 07/16/17   Vanetta MuldersZackowski, Khaleb Broz, MD  famotidine (PEPCID) 20 MG tablet Take 1 tablet (20 mg total) by mouth 2 (two) times daily. Patient not taking: Reported on 07/04/2017 07/23/12   Fayrene Helperran, Bowie, PA-C  ferrous sulfate 325 (65 FE) MG tablet Take 325 mg by mouth daily with breakfast.    [provider]  folic acid (FOLVITE) 1 MG tablet Take 1 mg by mouth daily.    [provider]  HYDROcodone-acetaminophen (NORCO/VICODIN) 5-325 MG tablet Take 1-2 tablets by mouth every 6 (six) hours as needed for severe pain. 07/04/17   Joy, Shawn C, PA-C  HYDROcodone-acetaminophen (NORCO/VICODIN) 5-325 MG tablet Take 1-2 tablets by mouth every 6 (six) hours as needed for moderate pain. 07/16/17   Vanetta MuldersZackowski, Michalene Debruler, MD  hydroxyurea (HYDREA) 500 MG capsule Take 500 mg by mouth daily. May take with food to minimize GI side effects.    [provider]  ibuprofen (ADVIL,MOTRIN) 600 MG tablet Take 1 tablet (600 mg total) by mouth every 6 (six) hours as needed. 05/27/17   Antony MaduraHumes, Kelly, PA-C  ibuprofen (ADVIL,MOTRIN) 600 MG tablet Take 1 tablet (600 mg total) by mouth  every 6 (six) hours as needed. 07/04/17   Joy, Shawn C, PA-C  lidocaine (LIDODERM) 5 % Place 1 patch onto the skin daily. Remove & Discard patch within 12 hours or as directed by MD 07/04/17   Joy, Shawn C, PA-C  methocarbamol (ROBAXIN) 500 MG tablet Take 1 tablet (500 mg total) by mouth 2 (two) times daily. 07/04/17   Joy, Shawn C, PA-C  naproxen (NAPROSYN) 500 MG tablet Take 1 tablet (500 mg total) by mouth 2 (two) times daily. 07/16/17   Vanetta MuldersZackowski, Mateja Dier, MD  pantoprazole (PROTONIX) 20 MG tablet Take 1 tablet (20 mg total) by mouth daily. Patient not taking: Reported on 07/04/2017  07/23/12   Fayrene Helperran, Bowie, PA-C    Family History History reviewed. No pertinent family history.  Social History Social History   Tobacco Use  . Smoking status: Never Smoker  . Smokeless tobacco: Never Used  Substance Use Topics  . Alcohol use: Yes    Alcohol/week: 7.2 oz    Types: 12 Cans of beer per week    Comment: Pint of liquor a day  . Drug use: Yes    Types: Marijuana     Allergies   Eggs or egg-derived products; Iodine; and Peanut-containing drug products   Review of Systems Review of Systems  Constitutional: Negative for fever.  HENT: Negative for congestion.   Eyes: Negative for visual disturbance.  Respiratory: Negative for shortness of breath.   Cardiovascular: Negative for chest pain.  Gastrointestinal: Negative for abdominal pain.  Genitourinary: Negative for difficulty urinating and dysuria.  Musculoskeletal: Positive for back pain.  Skin: Negative for rash.  Neurological: Negative for weakness and numbness.  Hematological: Does not bruise/bleed easily.  Psychiatric/Behavioral: Negative for confusion.     Physical Exam Updated Vital Signs BP 120/73   Pulse 65   Temp 99.4 F (37.4 C) (Oral)   Resp 14   Ht 1.702 m (5\' 7" )   Wt 72.6 kg (160 lb)   SpO2 93%   BMI 25.06 kg/m   Physical Exam  Constitutional: He is oriented to person, place, and time. He appears well-developed and well-nourished. No distress.  HENT:  Head: Normocephalic and atraumatic.  Mouth/Throat: Oropharynx is clear and moist.  Eyes: Conjunctivae and EOM are normal. Pupils are equal, round, and reactive to light.  Neck: Normal range of motion. Neck supple.  Cardiovascular: Normal rate, regular rhythm and normal heart sounds.  Pulmonary/Chest: Effort normal and breath sounds normal.  Abdominal: Soft. Bowel sounds are normal. There is no tenderness.  Musculoskeletal: Normal range of motion.  Neurological: He is alert and oriented to person, place, and time. No cranial nerve  deficit or sensory deficit. He exhibits normal muscle tone. Coordination normal.  Skin: Skin is warm.  Nursing note and vitals reviewed.    ED Treatments / Results  Labs (all labs ordered are listed, but only abnormal results are displayed) Labs Reviewed  BASIC METABOLIC PANEL - Abnormal; Notable for the following components:      Result Value   Glucose, Bld 122 (*)    All other components within normal limits  CBC WITH DIFFERENTIAL/PLATELET    EKG  EKG Interpretation None       Radiology Ct Renal Stone Study  Result Date: 07/16/2017 CLINICAL DATA:  Left-sided flank pain, history of sickle cell anemia and prior pancreatitis. EXAM: CT ABDOMEN AND PELVIS WITHOUT CONTRAST TECHNIQUE: Multidetector CT imaging of the abdomen and pelvis was performed following the standard protocol without IV contrast. COMPARISON:  07/23/2012  FINDINGS: Lower chest: Mild dependent atelectatic changes are noted. Hepatobiliary: No focal liver abnormality is seen. No gallstones, gallbladder wall thickening, or biliary dilatation. Pancreas: Unremarkable. No pancreatic ductal dilatation or surrounding inflammatory changes. Spleen: Normal in size without focal abnormality. Adrenals/Urinary Tract: The adrenal glands are within normal limits. The kidneys show no renal calculi or obstructive changes. No mass lesion is seen. The bladder is partially distended. Stomach/Bowel: The stomach is distended with ingested food stuffs. The appendix is not well appreciated. No inflammatory changes to suggest appendicitis are noted. Vascular/Lymphatic: No significant vascular findings are present. No enlarged abdominal or pelvic lymph nodes. Reproductive: Prostate is unremarkable. Other: No abdominal wall hernia or abnormality. No abdominopelvic ascites. Musculoskeletal: No acute or significant osseous findings. IMPRESSION: No renal or ureteral calculi are noted. No acute abnormality is noted. Electronically Signed   By: Alcide Clever  M.D.   On: 07/16/2017 20:17    Procedures Procedures (including critical care time)  Medications Ordered in ED Medications  0.9 %  sodium chloride infusion ( Intravenous New Bag/Given 07/16/17 2040)  sodium chloride 0.9 % bolus 500 mL (0 mLs Intravenous Stopped 07/16/17 2218)  ondansetron (ZOFRAN) injection 4 mg (4 mg Intravenous Given 07/16/17 2038)  HYDROmorphone (DILAUDID) injection 1 mg (1 mg Intravenous Given 07/16/17 2039)  ketorolac (TORADOL) 30 MG/ML injection 30 mg (30 mg Intravenous Given 07/16/17 2038)  diazepam (VALIUM) tablet 5 mg (5 mg Oral Given 07/16/17 2036)     Initial Impression / Assessment and Plan / ED Course  I have reviewed the triage vital signs and the nursing notes.  Pertinent labs & imaging results that were available during my care of the patient were reviewed by me and considered in my medical decision making (see chart for details).    CT scan was done to rule out kidney stone is possibility since the pain was so acute.  It was negative.  Labs without significant abnormality.  Symptoms most likely musculoskeletal back pain.  Patient improved here with pain medicine and Valium.  Will discharge home with Naprosyn short course of hydrocodone and a short course of Valium.   Final Clinical Impressions(s) / ED Diagnoses   Final diagnoses:  Acute left-sided low back pain without sciatica    ED Discharge Orders        Ordered    naproxen (NAPROSYN) 500 MG tablet  2 times daily     07/16/17 2251    diazepam (VALIUM) 5 MG tablet  2 times daily     07/16/17 2251    HYDROcodone-acetaminophen (NORCO/VICODIN) 5-325 MG tablet  Every 6 hours PRN     07/16/17 2251       Vanetta Mulders, MD 07/17/17 2006    Vanetta Mulders, MD 07/29/17 1555

## 2017-07-16 NOTE — ED Triage Notes (Addendum)
Pt states that he was headed to get something to eat, but he begin having pain in lower back. Pt states that he is unable to stand or walk or lay on left side. Pt states that started "again" today but he came two weeks ago for same issue. Pt states that he took 2 muscle relaxers about 45 mins ago that are not prescribed to him. Pt states pain is so bad it has groin hurting.

## 2017-09-01 ENCOUNTER — Encounter (HOSPITAL_COMMUNITY): Payer: Self-pay | Admitting: Emergency Medicine

## 2017-09-01 ENCOUNTER — Emergency Department (HOSPITAL_COMMUNITY)
Admission: EM | Admit: 2017-09-01 | Discharge: 2017-09-01 | Disposition: A | Discharge status: Home / Self Care | Payer: Self-pay | Attending: Emergency Medicine | Admitting: Emergency Medicine

## 2017-09-01 ENCOUNTER — Emergency Department (HOSPITAL_COMMUNITY): Payer: Self-pay

## 2017-09-01 DIAGNOSIS — R05 Cough: Secondary | ICD-10-CM | POA: Insufficient documentation

## 2017-09-01 DIAGNOSIS — Z5321 Procedure and treatment not carried out due to patient leaving prior to being seen by health care provider: Secondary | ICD-10-CM | POA: Insufficient documentation

## 2017-09-01 LAB — CBC
HEMATOCRIT: 44.4 % (ref 39.0–52.0)
HEMOGLOBIN: 14.8 g/dL (ref 13.0–17.0)
MCH: 29.2 pg (ref 26.0–34.0)
MCHC: 33.3 g/dL (ref 30.0–36.0)
MCV: 87.6 fL (ref 78.0–100.0)
Platelets: 217 10*3/uL (ref 150–400)
RBC: 5.07 MIL/uL (ref 4.22–5.81)
RDW: 14.1 % (ref 11.5–15.5)
WBC: 9.4 10*3/uL (ref 4.0–10.5)

## 2017-09-01 LAB — COMPREHENSIVE METABOLIC PANEL
ALBUMIN: 4.5 g/dL (ref 3.5–5.0)
ALT: 13 U/L — ABNORMAL LOW (ref 17–63)
ANION GAP: 9 (ref 5–15)
AST: 22 U/L (ref 15–41)
Alkaline Phosphatase: 51 U/L (ref 38–126)
BUN: 9 mg/dL (ref 6–20)
CO2: 24 mmol/L (ref 22–32)
Calcium: 8.9 mg/dL (ref 8.9–10.3)
Chloride: 102 mmol/L (ref 101–111)
Creatinine, Ser: 1 mg/dL (ref 0.61–1.24)
GFR calc Af Amer: >60 mL/min (ref >60)
GFR calc non Af Amer: >60 mL/min (ref >60)
GLUCOSE: 99 mg/dL (ref 65–99)
POTASSIUM: 3.5 mmol/L (ref 3.5–5.1)
SODIUM: 135 mmol/L (ref 135–145)
Total Bilirubin: 0.2 mg/dL — ABNORMAL LOW (ref 0.3–1.2)
Total Protein: 8.2 g/dL — ABNORMAL HIGH (ref 6.5–8.1)

## 2017-09-01 LAB — LIPASE, BLOOD: LIPASE: 27 U/L (ref 11–51)

## 2017-09-01 NOTE — ED Triage Notes (Signed)
Patient here from home with complaints of productive cough, nausea, vomiting, diarrhea x3 days.

## 2017-09-01 NOTE — ED Notes (Signed)
Pt called  No response from lobby  

## 2017-09-01 NOTE — ED Notes (Signed)
Pt called for recheck v/s, no response from lobby 

## 2019-05-15 ENCOUNTER — Encounter (HOSPITAL_BASED_OUTPATIENT_CLINIC_OR_DEPARTMENT_OTHER): Payer: Self-pay | Admitting: Emergency Medicine

## 2019-05-15 ENCOUNTER — Emergency Department (HOSPITAL_BASED_OUTPATIENT_CLINIC_OR_DEPARTMENT_OTHER)
Admission: EM | Admit: 2019-05-15 | Discharge: 2019-05-15 | Disposition: A | Discharge status: Home / Self Care | Payer: Self-pay | Attending: Emergency Medicine | Admitting: Emergency Medicine

## 2019-05-15 ENCOUNTER — Other Ambulatory Visit: Payer: Self-pay

## 2019-05-15 ENCOUNTER — Emergency Department (HOSPITAL_BASED_OUTPATIENT_CLINIC_OR_DEPARTMENT_OTHER): Payer: Self-pay

## 2019-05-15 DIAGNOSIS — X509XXA Other and unspecified overexertion or strenuous movements or postures, initial encounter: Secondary | ICD-10-CM | POA: Insufficient documentation

## 2019-05-15 DIAGNOSIS — S63632A Sprain of interphalangeal joint of right middle finger, initial encounter: Secondary | ICD-10-CM | POA: Insufficient documentation

## 2019-05-15 DIAGNOSIS — F17210 Nicotine dependence, cigarettes, uncomplicated: Secondary | ICD-10-CM | POA: Insufficient documentation

## 2019-05-15 DIAGNOSIS — Z91012 Allergy to eggs: Secondary | ICD-10-CM | POA: Insufficient documentation

## 2019-05-15 DIAGNOSIS — J069 Acute upper respiratory infection, unspecified: Secondary | ICD-10-CM | POA: Insufficient documentation

## 2019-05-15 DIAGNOSIS — Z79899 Other long term (current) drug therapy: Secondary | ICD-10-CM | POA: Insufficient documentation

## 2019-05-15 DIAGNOSIS — Y999 Unspecified external cause status: Secondary | ICD-10-CM | POA: Insufficient documentation

## 2019-05-15 DIAGNOSIS — Z91041 Radiographic dye allergy status: Secondary | ICD-10-CM | POA: Insufficient documentation

## 2019-05-15 DIAGNOSIS — Z91010 Allergy to peanuts: Secondary | ICD-10-CM | POA: Insufficient documentation

## 2019-05-15 DIAGNOSIS — Y9354 Activity, bowling: Secondary | ICD-10-CM | POA: Insufficient documentation

## 2019-05-15 DIAGNOSIS — Y9239 Other specified sports and athletic area as the place of occurrence of the external cause: Secondary | ICD-10-CM | POA: Insufficient documentation

## 2019-05-15 MED ORDER — ALBUTEROL SULFATE HFA 108 (90 BASE) MCG/ACT IN AERS: 2.0000 | INHALATION_SPRAY | RESPIRATORY_TRACT | 0 refills | Status: AC | PRN

## 2019-05-15 MED ORDER — BENZONATATE 200 MG PO CAPS: 200.0000 mg | ORAL_CAPSULE | Freq: Three times a day (TID) | ORAL | 0 refills | Status: AC | PRN

## 2019-05-15 MED FILL — BENZONATATE 200 MG CAP: 200 | 6 days supply | Qty: 20 | Fill #0

## 2019-05-15 MED FILL — PROAIR HFA 90 MCG INHALER: 108 (90 BAS | 17 days supply | Qty: 9 | Fill #0

## 2019-05-15 NOTE — Discharge Instructions (Addendum)
Continue with allergy medicines as directed. Use saline sinus rinse with distilled water twice daily. Use Flonase twice daily for 5 days, then continue with daily use. Take Tessalon as needed as prescribed for cough. Use inhaler as needed as prescribed for cough.  Buddy tape fingers for sprained finger. Apply ice and elevate to help with pain and swelling. Take Motrin daily as directed for pain and swelling. Follow up with sports medicine- referral given, in 1 week if not improving.

## 2019-05-15 NOTE — ED Triage Notes (Addendum)
Pt injured R middle finger yesterday while bowling. Also c/o cough x 1 month with no other cold symptoms. He is a smoker.

## 2019-05-15 NOTE — ED Provider Notes (Signed)
[facility] EMERGENCY DEPARTMENT Provider Note   CSN: [id number] Arrival date & time: 05/15/19  0908     History   Chief Complaint Chief Complaint  Patient presents with   Finger Injury   Cough    HPI Dustin Franklin is a 38 y.o. male.     38 year old male presents with complaint of right third finger injury.  Patient states that he was bowling last night, threw the ball and tried to put a splint on it however his right third finger remained stuck in the ball and twisted his finger.  Patient reports pain at the right third PIP.  No other injuries to the hand.  Patient is right-hand dominant.  Patient also complains of cough x1 month.  Patient states he was released from prison on August 11 and developed a cough at the beginning of September.  Cough is occasionally productive with various colors of sputum.  Patient denies fevers, chills, body aches, diarrhea, loss of sense of smell or taste, sick contacts.  Patient reports history of asthma and bronchitis, also multiple seasonal allergies and takes allergy medicines.  No other complaints or concerns.  Dustin Franklin was evaluated in Emergency Department on 05/15/2019 for the symptoms described in the history of present illness. He was evaluated in the context of the global COVID-19 pandemic, which necessitated consideration that the patient might be at risk for infection with the SARS-CoV-2 virus that causes COVID-19. Institutional protocols and algorithms that pertain to the evaluation of patients at risk for COVID-19 are in a state of rapid change based on information released by regulatory bodies including the CDC and federal and state organizations. These policies and algorithms were followed during the patient's care in the ED.      Past Medical History:  Diagnosis Date   Bronchitis    Pancreatitis    Sickle cell anemia (HCC)     There are no active problems to display for this patient.   History  reviewed. No pertinent surgical history.      Home Medications    Prior to Admission medications   Medication Sig Start Date End Date Taking? Authorizing Provider  acetaminophen (TYLENOL) 325 MG tablet Take 650 mg by mouth every 6 (six) hours as needed for mild pain.    [provider]  acetaminophen-codeine (TYLENOL #3) 300-30 MG per tablet Take 1-2 tablets by mouth every 6 (six) hours as needed for moderate pain. Patient not taking: Reported on 07/04/2017 08/22/13   Hayden Rasmussen, NP  albuterol (VENTOLIN HFA) 108 (90 Base) MCG/ACT inhaler Inhale 2 puffs into the lungs every 4 (four) hours as needed for wheezing or shortness of breath. 05/15/19 06/14/19  Jeannie Fend, PA-C  amoxicillin-clavulanate (AUGMENTIN) 875-125 MG tablet Take 1 tablet by mouth every 12 (twelve) hours. Patient not taking: Reported on 07/04/2017 05/27/17   Antony Madura, PA-C  benzonatate (TESSALON) 200 MG capsule Take 1 capsule (200 mg total) by mouth 3 (three) times daily as needed for up to 7 days for cough. 05/15/19 05/22/19  Jeannie Fend, PA-C  diazepam (VALIUM) 5 MG tablet Take 1 tablet (5 mg total) by mouth 2 (two) times daily. 07/16/17   Vanetta Mulders, MD  famotidine (PEPCID) 20 MG tablet Take 1 tablet (20 mg total) by mouth 2 (two) times daily. Patient not taking: Reported on 07/04/2017 07/23/12   Fayrene Helper, PA-C  ferrous sulfate 325 (65 FE) MG tablet Take 325 mg by mouth daily with breakfast.  [provider]  folic acid (FOLVITE) 1 MG tablet Take 1 mg by mouth daily.    [provider]  HYDROcodone-acetaminophen (NORCO/VICODIN) 5-325 MG tablet Take 1-2 tablets by mouth every 6 (six) hours as needed for severe pain. 07/04/17   Joy, Shawn C, PA-C  HYDROcodone-acetaminophen (NORCO/VICODIN) 5-325 MG tablet Take 1-2 tablets by mouth every 6 (six) hours as needed for moderate pain. 07/16/17   Vanetta MuldersZackowski, Scott, MD  hydroxyurea (HYDREA) 500 MG capsule Take 500 mg by mouth daily. May take  with food to minimize GI side effects.    [provider]  ibuprofen (ADVIL,MOTRIN) 600 MG tablet Take 1 tablet (600 mg total) by mouth every 6 (six) hours as needed. 05/27/17   Antony MaduraHumes, Kelly, PA-C  ibuprofen (ADVIL,MOTRIN) 600 MG tablet Take 1 tablet (600 mg total) by mouth every 6 (six) hours as needed. 07/04/17   Joy, Shawn C, PA-C  lidocaine (LIDODERM) 5 % Place 1 patch onto the skin daily. Remove & Discard patch within 12 hours or as directed by MD 07/04/17   Joy, Shawn C, PA-C  methocarbamol (ROBAXIN) 500 MG tablet Take 1 tablet (500 mg total) by mouth 2 (two) times daily. 07/04/17   Joy, Shawn C, PA-C  naproxen (NAPROSYN) 500 MG tablet Take 1 tablet (500 mg total) by mouth 2 (two) times daily. 07/16/17   Vanetta MuldersZackowski, Scott, MD  pantoprazole (PROTONIX) 20 MG tablet Take 1 tablet (20 mg total) by mouth daily. Patient not taking: Reported on 07/04/2017 07/23/12   Fayrene Helperran, [place], PA-C    Family History No family history on file.  Social History Social History   Tobacco Use   Smoking status: Current Every Day Smoker   Smokeless tobacco: Never Used  Substance Use Topics   Alcohol use: Yes    Alcohol/week: 12.0 standard drinks    Types: 12 Cans of beer per week    Comment: Pint of liquor a day   Drug use: Yes    Types: Marijuana     Allergies   Eggs or egg-derived products, Iodine, and Peanut-containing drug products   Review of Systems Review of Systems  Constitutional: Negative for chills, diaphoresis and fever.  HENT: Positive for congestion. Negative for ear pain, sinus pressure, sinus pain, sneezing and sore throat.   Respiratory: Positive for cough. Negative for shortness of breath and wheezing.   Gastrointestinal: Negative for diarrhea, nausea and vomiting.  Musculoskeletal: Positive for arthralgias, joint swelling and myalgias.  Skin: Negative for rash and wound.  Neurological: Negative for weakness and numbness.  Hematological: Does not bruise/bleed easily.    Psychiatric/Behavioral: Negative for confusion.  All other systems reviewed and are negative.    Physical Exam Updated Vital Signs BP (!) 140/95 (BP Location: Left Arm)    Pulse 89    Temp 98.6 F (37 C) (Oral)    Resp 18    Ht 5\' 7"  (1.702 m)    Wt 73.9 kg    SpO2 100%    BMI 25.53 kg/m   Physical Exam Vitals signs and nursing note reviewed.  Constitutional:      General: He is not in acute distress.    Appearance: He is well-developed. He is not diaphoretic.  HENT:     Head: Normocephalic and atraumatic.     Right Ear: Tympanic membrane and ear canal normal.     Left Ear: Tympanic membrane and ear canal normal.  Cardiovascular:     Rate and Rhythm: Normal rate and regular rhythm.  Pulses: Normal pulses.     Heart sounds: Normal heart sounds.  Pulmonary:     Effort: Pulmonary effort is normal.     Breath sounds: Normal breath sounds.  Musculoskeletal:        General: Swelling, tenderness and signs of injury present. No deformity.       Hands:  Skin:    General: Skin is warm and dry.     Findings: No erythema or rash.  Neurological:     Mental Status: He is alert and oriented to person, place, and time.  Psychiatric:        Behavior: Behavior normal.      ED Treatments / Results  Labs (all labs ordered are listed, but only abnormal results are displayed) Labs Reviewed - No data to display  EKG None  Radiology Dg Chest 2 View  Result Date: 05/15/2019 CLINICAL DATA:  Productive cough for 1 month. EXAM: CHEST - 2 VIEW COMPARISON:  09/01/2017 FINDINGS: Cardiac silhouette is normal in size. No mediastinal or hilar masses. No evidence of adenopathy. Clear lungs.  No pleural effusion or pneumothorax. Skeletal structures are unremarkable. IMPRESSION: No active cardiopulmonary disease. Electronically Signed   By: Lajean Manes M.D.   On: 05/15/2019 10:25   Dg Finger Middle Right  Result Date: 05/15/2019 CLINICAL DATA:  Right middle finger injury last night while  bowling. EXAM: RIGHT MIDDLE FINGER 2+V COMPARISON:  None. FINDINGS: Soft tissue swelling over the dorsal aspect of the third PIP joint. No evidence of acute fracture or dislocation. IMPRESSION: No acute fracture. Electronically Signed   By: Marin Olp M.D.   On: 05/15/2019 10:22    Procedures Procedures (including critical care time)  Medications Ordered in ED Medications - No data to display   Initial Impression / Assessment and Plan / ED Course  I have reviewed the triage vital signs and the nursing notes.  Pertinent labs & imaging results that were available during my care of the patient were reviewed by me and considered in my medical decision making (see chart for details).  Clinical Course as of May 15 1035  Tue Oct 06, 26108  1937 38 year old male presents with complaint of cough x1 month as well as right third finger injury. On exam lung sounds are clear, patient is well-appearing.  Chest x-ray is unremarkable.  Patient given refill of inhaler and Tessalon to use as needed as prescribed for his cough. Patient has limited extension of the right third PIP with swelling and ecchymosis in the area.  X-ray of the finger is negative for fracture or dislocation.  Fingers will be buddy taped for splinting, advised to ice and elevate, take Motrin.  Follow-up with sports medicine in 1 week if not improving.   [LM]    Clinical Course User Index [LM] Tacy Learn, PA-C      Final Clinical Impressions(s) / ED Diagnoses   Final diagnoses:  Viral upper respiratory tract infection  Sprain of interphalangeal joint of right middle finger, initial encounter    ED Discharge Orders         Ordered    albuterol (VENTOLIN HFA) 108 (90 Base) MCG/ACT inhaler  Every 4 hours PRN     05/15/19 1031    benzonatate (TESSALON) 200 MG capsule  3 times daily PRN     05/15/19 1031           Tacy Learn, PA-C 05/15/19 1036    Charlesetta Shanks, MD 05/15/19 1206

## 2019-05-15 NOTE — ED Notes (Signed)
Patient denies pain and is resting comfortably.  

## 2019-05-24 ENCOUNTER — Emergency Department (HOSPITAL_COMMUNITY)
Admission: EM | Admit: 2019-05-24 | Discharge: 2019-05-24 | Disposition: A | Discharge status: Home / Self Care | Payer: No Typology Code available for payment source | Attending: Emergency Medicine | Admitting: Emergency Medicine

## 2019-05-24 ENCOUNTER — Emergency Department (HOSPITAL_COMMUNITY): Payer: No Typology Code available for payment source

## 2019-05-24 ENCOUNTER — Other Ambulatory Visit: Payer: Self-pay

## 2019-05-24 ENCOUNTER — Encounter (HOSPITAL_COMMUNITY): Payer: Self-pay | Admitting: Emergency Medicine

## 2019-05-24 ENCOUNTER — Encounter (HOSPITAL_COMMUNITY): Payer: Self-pay

## 2019-05-24 ENCOUNTER — Emergency Department (HOSPITAL_COMMUNITY)
Admission: EM | Admit: 2019-05-24 | Discharge: 2019-05-24 | Disposition: A | Discharge status: Home / Self Care | Payer: No Typology Code available for payment source | Source: Home / Self Care | Attending: Emergency Medicine | Admitting: Emergency Medicine

## 2019-05-24 DIAGNOSIS — Z5321 Procedure and treatment not carried out due to patient leaving prior to being seen by health care provider: Secondary | ICD-10-CM | POA: Diagnosis not present

## 2019-05-24 DIAGNOSIS — M25511 Pain in right shoulder: Secondary | ICD-10-CM | POA: Insufficient documentation

## 2019-05-24 DIAGNOSIS — F1210 Cannabis abuse, uncomplicated: Secondary | ICD-10-CM | POA: Insufficient documentation

## 2019-05-24 DIAGNOSIS — F17210 Nicotine dependence, cigarettes, uncomplicated: Secondary | ICD-10-CM | POA: Insufficient documentation

## 2019-05-24 DIAGNOSIS — M549 Dorsalgia, unspecified: Secondary | ICD-10-CM | POA: Diagnosis present

## 2019-05-24 HISTORY — DX: Bone transplant status: Z94.6

## 2019-05-24 MED ORDER — IBUPROFEN 600 MG PO TABS: 600.0000 mg | ORAL_TABLET | Freq: Four times a day (QID) | ORAL | 0 refills | Status: DC | PRN

## 2019-05-24 MED ORDER — METHOCARBAMOL 500 MG PO TABS: 500.0000 mg | ORAL_TABLET | Freq: Three times a day (TID) | ORAL | 0 refills | Status: DC | PRN

## 2019-05-24 NOTE — ED Notes (Signed)
Patient verbalizes understanding of discharge instructions. Opportunity for questioning and answers were provided. Armband removed by staff, pt discharged from ED ambulatory by self\  

## 2019-05-24 NOTE — ED Notes (Signed)
Patient transported to X-ray 
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
## 2019-05-24 NOTE — ED Notes (Signed)
Called no answer
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
## 2019-11-12 ENCOUNTER — Encounter (HOSPITAL_COMMUNITY): Payer: Self-pay | Admitting: Emergency Medicine

## 2019-11-12 ENCOUNTER — Emergency Department (HOSPITAL_COMMUNITY)
Admission: EM | Admit: 2019-11-12 | Discharge: 2019-11-12 | Disposition: A | Discharge status: Home / Self Care | Payer: Self-pay | Attending: Emergency Medicine | Admitting: Emergency Medicine

## 2019-11-12 ENCOUNTER — Other Ambulatory Visit: Payer: Self-pay

## 2019-11-12 ENCOUNTER — Emergency Department (HOSPITAL_COMMUNITY): Payer: Self-pay

## 2019-11-12 DIAGNOSIS — Z91010 Allergy to peanuts: Secondary | ICD-10-CM | POA: Insufficient documentation

## 2019-11-12 DIAGNOSIS — F17200 Nicotine dependence, unspecified, uncomplicated: Secondary | ICD-10-CM | POA: Insufficient documentation

## 2019-11-12 DIAGNOSIS — R112 Nausea with vomiting, unspecified: Secondary | ICD-10-CM | POA: Insufficient documentation

## 2019-11-12 DIAGNOSIS — Z79899 Other long term (current) drug therapy: Secondary | ICD-10-CM | POA: Insufficient documentation

## 2019-11-12 DIAGNOSIS — K859 Acute pancreatitis without necrosis or infection, unspecified: Secondary | ICD-10-CM | POA: Insufficient documentation

## 2019-11-12 DIAGNOSIS — F1210 Cannabis abuse, uncomplicated: Secondary | ICD-10-CM | POA: Insufficient documentation

## 2019-11-12 DIAGNOSIS — F1510 Other stimulant abuse, uncomplicated: Secondary | ICD-10-CM | POA: Insufficient documentation

## 2019-11-12 LAB — COMPREHENSIVE METABOLIC PANEL
ALT: 14 U/L (ref 0–44)
AST: 21 U/L (ref 15–41)
Albumin: 4.3 g/dL (ref 3.5–5.0)
Alkaline Phosphatase: 43 U/L (ref 38–126)
Anion gap: 10 (ref 5–15)
BUN: 6 mg/dL (ref 6–20)
CO2: 25 mmol/L (ref 22–32)
Calcium: 9 mg/dL (ref 8.9–10.3)
Chloride: 105 mmol/L (ref 98–111)
Creatinine, Ser: 0.92 mg/dL (ref 0.61–1.24)
GFR calc Af Amer: >60 mL/min (ref >60)
GFR calc non Af Amer: >60 mL/min (ref >60)
Glucose, Bld: 84 mg/dL (ref 70–99)
Potassium: 4 mmol/L (ref 3.5–5.1)
Sodium: 140 mmol/L (ref 135–145)
Total Bilirubin: 0.6 mg/dL (ref 0.3–1.2)
Total Protein: 6.9 g/dL (ref 6.5–8.1)

## 2019-11-12 LAB — CBC
HCT: 47.4 % (ref 39.0–52.0)
Hemoglobin: 15.8 g/dL (ref 13.0–17.0)
MCH: 30.1 pg (ref 26.0–34.0)
MCHC: 33.3 g/dL (ref 30.0–36.0)
MCV: 90.3 fL (ref 80.0–100.0)
Platelets: 197 10*3/uL (ref 150–400)
RBC: 5.25 MIL/uL (ref 4.22–5.81)
RDW: 13.1 % (ref 11.5–15.5)
WBC: 9.8 10*3/uL (ref 4.0–10.5)
nRBC: 0 % (ref 0.0–0.2)

## 2019-11-12 LAB — URINALYSIS, ROUTINE W REFLEX MICROSCOPIC
Bilirubin Urine: NEGATIVE
Glucose, UA: NEGATIVE mg/dL
Hgb urine dipstick: NEGATIVE
Ketones, ur: NEGATIVE mg/dL
Leukocytes,Ua: NEGATIVE
Nitrite: NEGATIVE
Protein, ur: NEGATIVE mg/dL
Specific Gravity, Urine: 1.012 (ref 1.005–1.030)
pH: 7 (ref 5.0–8.0)

## 2019-11-12 LAB — LIPASE, BLOOD: Lipase: 108 U/L — ABNORMAL HIGH (ref 11–51)

## 2019-11-12 LAB — RAPID URINE DRUG SCREEN, HOSP PERFORMED
Amphetamines: POSITIVE — AB
Barbiturates: NOT DETECTED
Benzodiazepines: NOT DETECTED
Cocaine: NOT DETECTED
Opiates: NOT DETECTED
Tetrahydrocannabinol: POSITIVE — AB

## 2019-11-12 MED ORDER — SODIUM CHLORIDE 0.9% FLUSH
3.0000 mL | Freq: Once | INTRAVENOUS | Status: AC
  Administered 2019-11-12: 3 mL via INTRAVENOUS

## 2019-11-12 MED ORDER — SODIUM CHLORIDE 0.9 % IV BOLUS
1000.0000 mL | Freq: Once | INTRAVENOUS | Status: AC
  Administered 2019-11-12: 1000 mL via INTRAVENOUS

## 2019-11-12 MED ORDER — ONDANSETRON HCL 4 MG/2ML IJ SOLN
4.0000 mg | Freq: Once | INTRAMUSCULAR | Status: AC
  Administered 2019-11-12: 4 mg via INTRAVENOUS
  Filled 2019-11-12: qty 2

## 2019-11-12 MED ORDER — FENTANYL CITRATE (PF) 100 MCG/2ML IJ SOLN
50.0000 ug | Freq: Once | INTRAMUSCULAR | Status: AC
  Administered 2019-11-12: 50 ug via INTRAVENOUS
  Filled 2019-11-12: qty 2

## 2019-11-12 MED ORDER — IOHEXOL 300 MG/ML  SOLN
100.0000 mL | Freq: Once | INTRAMUSCULAR | Status: AC | PRN
  Administered 2019-11-12: 100 mL via INTRAVENOUS

## 2019-11-12 MED ORDER — ONDANSETRON 4 MG PO TBDP: 4.0000 mg | ORAL_TABLET | Freq: Three times a day (TID) | ORAL | 0 refills | Status: DC | PRN

## 2019-11-12 MED ORDER — METOCLOPRAMIDE HCL 5 MG/ML IJ SOLN
5.0000 mg | Freq: Once | INTRAMUSCULAR | Status: DC
  Filled 2019-11-12: qty 2

## 2019-11-12 NOTE — ED Provider Notes (Signed)
[facility] EMERGENCY DEPARTMENT Provider Note   CSN: [id number] Arrival date & time: 11/12/19  3893     History Chief Complaint  Patient presents with  . Abdominal Pain  . Gastroesophageal Reflux    Dustin Franklin is a 39 y.o. male.  Presented to ER with epigastric pain.  Reports history of pancreatitis, feels similar to prior.  Has been having worsening abdominal pain, nausea and vomiting over the past few days.  Feels like he cannot keep any food down, is able to tolerate water.  Vomit is primarily water, nonbilious, nonbloody.  No fevers, no lower abdominal pain, no dysuria.    HPI     Past Medical History:  Diagnosis Date  . Bronchitis   . Hx of bone transplant    for sickle cell  . Pancreatitis   . Sickle cell anemia (HCC)     There are no problems to display for this patient.   History reviewed. No pertinent surgical history.     No family history on file.  Social History   Tobacco Use  . Smoking status: Current Every Day Smoker  . Smokeless tobacco: Never Used  Substance Use Topics  . Alcohol use: Yes    Alcohol/week: 12.0 standard drinks    Types: 12 Cans of beer per week    Comment: Pint of liquor a day  . Drug use: Yes    Types: Marijuana    Home Medications Prior to Admission medications   Medication Sig Start Date End Date Taking? Authorizing Provider  acetaminophen (TYLENOL) 325 MG tablet Take 650 mg by mouth every 6 (six) hours as needed for mild pain.    [provider]  acetaminophen-codeine (TYLENOL #3) 300-30 MG per tablet Take 1-2 tablets by mouth every 6 (six) hours as needed for moderate pain. Patient not taking: Reported on 07/04/2017 08/22/13   Hayden Rasmussen, NP  albuterol (VENTOLIN HFA) 108 (90 Base) MCG/ACT inhaler Inhale 2 puffs into the lungs every 4 (four) hours as needed for wheezing or shortness of breath. 05/15/19 06/14/19  Jeannie Fend, PA-C  amoxicillin-clavulanate (AUGMENTIN) 875-125 MG tablet  Take 1 tablet by mouth every 12 (twelve) hours. Patient not taking: Reported on 07/04/2017 05/27/17   Antony Madura, PA-C  diazepam (VALIUM) 5 MG tablet Take 1 tablet (5 mg total) by mouth 2 (two) times daily. Patient not taking: Reported on 11/12/2019 07/16/17   Vanetta Mulders, MD  famotidine (PEPCID) 20 MG tablet Take 1 tablet (20 mg total) by mouth 2 (two) times daily. Patient not taking: Reported on 07/04/2017 07/23/12   Fayrene Helper, PA-C  ferrous sulfate 325 (65 FE) MG tablet Take 325 mg by mouth daily with breakfast.    [provider]  folic acid (FOLVITE) 1 MG tablet Take 1 mg by mouth daily.    [provider]  HYDROcodone-acetaminophen (NORCO/VICODIN) 5-325 MG tablet Take 1-2 tablets by mouth every 6 (six) hours as needed for severe pain. Patient not taking: Reported on 11/12/2019 07/04/17   Anselm Pancoast, PA-C  HYDROcodone-acetaminophen (NORCO/VICODIN) 5-325 MG tablet Take 1-2 tablets by mouth every 6 (six) hours as needed for moderate pain. Patient not taking: Reported on 11/12/2019 07/16/17   Vanetta Mulders, MD  hydroxyurea (HYDREA) 500 MG capsule Take 500 mg by mouth daily. May take with food to minimize GI side effects.    [provider]  ibuprofen (ADVIL) 600 MG tablet Take 1 tablet (600 mg total) by mouth every 6 (six) hours as  needed for moderate pain. Patient not taking: Reported on 11/12/2019 05/24/19   Fayrene Helper, PA-C  lidocaine (LIDODERM) 5 % Place 1 patch onto the skin daily. Remove & Discard patch within 12 hours or as directed by MD Patient not taking: Reported on 11/12/2019 07/04/17   Joy, Hillard Danker, PA-C  methocarbamol (ROBAXIN) 500 MG tablet Take 1 tablet (500 mg total) by mouth every 8 (eight) hours as needed for muscle spasms. Patient not taking: Reported on 11/12/2019 05/24/19   Fayrene Helper, PA-C  naproxen (NAPROSYN) 500 MG tablet Take 1 tablet (500 mg total) by mouth 2 (two) times daily. Patient not taking: Reported on 11/12/2019 07/16/17   Vanetta Mulders, MD  ondansetron (ZOFRAN ODT) 4 MG disintegrating tablet Take 1 tablet (4 mg total) by mouth every 8 (eight) hours as needed for nausea or vomiting. 11/12/19   Milagros Loll, MD  pantoprazole (PROTONIX) 20 MG tablet Take 1 tablet (20 mg total) by mouth daily. Patient not taking: Reported on 07/04/2017 07/23/12   Fayrene Helper, PA-C    Allergies    Beef-derived products, Eggs or egg-derived products, Iodine, Milk-related compounds, and Peanut-containing drug products  Review of Systems   Review of Systems  Constitutional: Negative for chills and fever.  HENT: Negative for ear pain and sore throat.   Eyes: Negative for pain and visual disturbance.  Respiratory: Negative for cough and shortness of breath.   Cardiovascular: Negative for chest pain and palpitations.  Gastrointestinal: Positive for abdominal pain, nausea and vomiting.  Genitourinary: Negative for dysuria and hematuria.  Musculoskeletal: Negative for arthralgias and back pain.  Skin: Negative for color change and rash.  Neurological: Negative for seizures and syncope.  All other systems reviewed and are negative.   Physical Exam Updated Vital Signs BP (!) 140/96   Pulse 60   Temp (!) 97.5 F (36.4 C) (Oral)   Resp 16   Ht 5\' 7"  (1.702 m)   Wt 69.9 kg   SpO2 96%   BMI 24.12 kg/m   Physical Exam Vitals and nursing note reviewed.  Constitutional:      Appearance: He is well-developed.  HENT:     Head: Normocephalic and atraumatic.  Eyes:     Conjunctiva/sclera: Conjunctivae normal.  Cardiovascular:     Rate and Rhythm: Normal rate and regular rhythm.     Heart sounds: No murmur.  Pulmonary:     Effort: Pulmonary effort is normal. No respiratory distress.     Breath sounds: Normal breath sounds.  Abdominal:     General: Bowel sounds are normal.     Palpations: Abdomen is soft.     Tenderness: There is abdominal tenderness in the epigastric area. There is no guarding or rebound.  Musculoskeletal:      Cervical back: Neck supple.  Skin:    General: Skin is warm and dry.     Capillary Refill: Capillary refill takes less than 2 seconds.  Neurological:     General: No focal deficit present.     Mental Status: He is alert and oriented to person, place, and time.     ED Results / Procedures / Treatments   Labs (all labs ordered are listed, but only abnormal results are displayed) Labs Reviewed  LIPASE, BLOOD - Abnormal; Notable for the following components:      Result Value   Lipase 108 (*)    All other components within normal limits  RAPID URINE DRUG SCREEN, HOSP PERFORMED - Abnormal; Notable for the following  components:   Amphetamines POSITIVE (*)    Tetrahydrocannabinol POSITIVE (*)    All other components within normal limits  COMPREHENSIVE METABOLIC PANEL  CBC  URINALYSIS, ROUTINE W REFLEX MICROSCOPIC    EKG None  Radiology CT ABDOMEN PELVIS W CONTRAST  Result Date: 11/12/2019 CLINICAL DATA:  Generalized abdominal pain EXAM: CT ABDOMEN AND PELVIS WITH CONTRAST TECHNIQUE: Multidetector CT imaging of the abdomen and pelvis was performed using the standard protocol following bolus administration of intravenous contrast. CONTRAST:  155mL OMNIPAQUE IOHEXOL 300 MG/ML  SOLN COMPARISON:  July 16, 2017 FINDINGS: Lower chest: There is slight bibasilar atelectasis. No lung base edema or consolidation evident. Hepatobiliary: There is hepatic steatosis. No focal liver lesions are evident. Gallbladder wall is not appreciably thickened. There is no biliary duct dilatation. Pancreas: Pancreas normal in size and contour. No pancreatic mass or inflammatory focus. No peripancreatic fluid. Spleen: No splenic lesions are evident. There is a 4 mm probable cyst arising from the upper pole of the right kidney. There is no evident hydronephrosis on either side. There is no evident renal or ureteral calculus on either side. Urinary bladder is midline with wall thickness within normal limits.  Stomach/Bowel: There is no appreciable bowel wall or mesenteric thickening. No evident bowel obstruction. Terminal ileum appears normal. There is no demonstrable free air or portal venous air. Vascular/Lymphatic: No abdominal aortic aneurysm. No arterial vascular lesions evident. Major venous structures appear patent. No evident adenopathy in the abdomen or pelvis. Reproductive: Prostate and seminal vesicles are normal in size and contour. No pelvic mass is evident. Other: Appendix appears unremarkable. No evident abscess or ascites in the abdomen or pelvis. Musculoskeletal: No blastic or lytic bone lesions. No intramuscular or abdominal wall lesions. IMPRESSION: 1. A cause for patient's symptoms has not been established with this study. 2. No evident bowel wall thickening or bowel obstruction. No abscess in the abdomen or pelvis. Appendix appears normal. 3.  Pancreas appears normal. 4. No renal or ureteral calculus. No hydronephrosis. Urinary bladder wall thickness is within normal limits. Electronically Signed   By: Lowella Grip III M.D.   On: 11/12/2019 09:41    Procedures Procedures (including critical care time)  Medications Ordered in ED Medications  metoCLOPramide (REGLAN) injection 5 mg (5 mg Intravenous Not Given 11/12/19 1035)  sodium chloride flush (NS) 0.9 % injection 3 mL (3 mLs Intravenous Given 11/12/19 0849)  ondansetron (ZOFRAN) injection 4 mg (4 mg Intravenous Given 11/12/19 0847)  fentaNYL (SUBLIMAZE) injection 50 mcg (50 mcg Intravenous Given 11/12/19 0848)  sodium chloride 0.9 % bolus 1,000 mL (0 mLs Intravenous Stopped 11/12/19 1035)  iohexol (OMNIPAQUE) 300 MG/ML solution 100 mL (100 mLs Intravenous Contrast Given 11/12/19 2353)    ED Course  I have reviewed the triage vital signs and the nursing notes.  Pertinent labs & imaging results that were available during my care of the patient were reviewed by me and considered in my medical decision making (see chart for details).      MDM Rules/Calculators/A&P                       39 year old male presenting to ER with epigastric pain nausea and vomiting.  On exam patient noted to be very well-appearing with normal vital signs.  Tenderness over epigastrium but otherwise soft abdomen.  Lipase slightly elevated, CT scan negative.  Suspect mild pancreatitis.  Symptoms well controlled in ER, tolerating fluids without difficulty, believe he is appropriate for discharge and outpatient  management at this time.  Recommend recheck with primary doctor, return for worsening symptoms.    After the discussed management above, the patient was determined to be safe for discharge.  The patient was in agreement with this plan and all questions regarding their care were answered.  ED return precautions were discussed and the patient will return to the ED with any significant worsening of condition.    Final Clinical Impression(s) / ED Diagnoses Final diagnoses:  Acute pancreatitis without infection or necrosis, unspecified pancreatitis type    Rx / DC Orders ED Discharge Orders         Ordered    ondansetron (ZOFRAN ODT) 4 MG disintegrating tablet  Every 8 hours PRN     11/12/19 1006           Milagros Loll, MD 11/12/19 1043

## 2019-11-12 NOTE — ED Notes (Signed)
Pt did not want reglan. Stated he needed to get home. Discharge paperwork reviewed and IV taken out.

## 2019-11-12 NOTE — Discharge Instructions (Signed)
Please schedule follow-up appointment with your primary doctor regarding symptoms you are experiencing today.  Take nausea medicine as needed for pain and nausea.  Additionally recommend taking Tylenol as needed for pain.  Return to ER if you develop worsening nausea, vomiting, inability to tolerate fluids or other new concerning symptom.

## 2019-11-12 NOTE — ED Triage Notes (Signed)
Pt. Stated, I think I have pancreatitis again, Iv been drinking alcohol more than usual and that's probably has triggered it. Im having stomach pain and acid reflux. Water is the only fluid I can drink. It started a week and half ago.

## 2019-11-12 NOTE — ED Notes (Signed)
Went in to ask patient to get a urine sample patient stated that he couldn't get a urine sample at this time will try later

## 2019-11-30 ENCOUNTER — Emergency Department (HOSPITAL_COMMUNITY)
Admission: EM | Admit: 2019-11-30 | Discharge: 2019-12-01 | Disposition: A | Discharge status: Home / Self Care | Payer: Self-pay | Attending: Emergency Medicine | Admitting: Emergency Medicine

## 2019-11-30 ENCOUNTER — Other Ambulatory Visit: Payer: Self-pay

## 2019-11-30 DIAGNOSIS — Z5321 Procedure and treatment not carried out due to patient leaving prior to being seen by health care provider: Secondary | ICD-10-CM | POA: Insufficient documentation

## 2019-11-30 DIAGNOSIS — R109 Unspecified abdominal pain: Secondary | ICD-10-CM | POA: Insufficient documentation

## 2019-11-30 NOTE — ED Triage Notes (Signed)
Patient states that is pancreas "is acting up again". This has been going on x2 days.

## 2019-12-01 LAB — COMPREHENSIVE METABOLIC PANEL
ALT: 16 U/L (ref 0–44)
AST: 20 U/L (ref 15–41)
Albumin: 4 g/dL (ref 3.5–5.0)
Alkaline Phosphatase: 39 U/L (ref 38–126)
Anion gap: 8 (ref 5–15)
BUN: 12 mg/dL (ref 6–20)
CO2: 26 mmol/L (ref 22–32)
Calcium: 8.5 mg/dL — ABNORMAL LOW (ref 8.9–10.3)
Chloride: 105 mmol/L (ref 98–111)
Creatinine, Ser: 0.88 mg/dL (ref 0.61–1.24)
GFR calc Af Amer: >60 mL/min (ref >60)
GFR calc non Af Amer: >60 mL/min (ref >60)
Glucose, Bld: 97 mg/dL (ref 70–99)
Potassium: 4 mmol/L (ref 3.5–5.1)
Sodium: 139 mmol/L (ref 135–145)
Total Bilirubin: 0.3 mg/dL (ref 0.3–1.2)
Total Protein: 6.4 g/dL — ABNORMAL LOW (ref 6.5–8.1)

## 2019-12-01 LAB — CBC
HCT: 43.4 % (ref 39.0–52.0)
Hemoglobin: 14.3 g/dL (ref 13.0–17.0)
MCH: 29.9 pg (ref 26.0–34.0)
MCHC: 32.9 g/dL (ref 30.0–36.0)
MCV: 90.6 fL (ref 80.0–100.0)
Platelets: 218 10*3/uL (ref 150–400)
RBC: 4.79 MIL/uL (ref 4.22–5.81)
RDW: 13.4 % (ref 11.5–15.5)
WBC: 11 10*3/uL — ABNORMAL HIGH (ref 4.0–10.5)
nRBC: 0 % (ref 0.0–0.2)

## 2019-12-01 LAB — LIPASE, BLOOD: Lipase: 35 U/L (ref 11–51)

## 2019-12-01 NOTE — ED Notes (Signed)
Pt seen leaving and has not come back in last 35 minutes, not seen outside of lobby

## 2019-12-21 ENCOUNTER — Emergency Department (HOSPITAL_COMMUNITY)
Admission: EM | Admit: 2019-12-21 | Discharge: 2019-12-21 | Disposition: A | Discharge status: Home / Self Care | Payer: Self-pay | Attending: Emergency Medicine | Admitting: Emergency Medicine

## 2019-12-21 ENCOUNTER — Other Ambulatory Visit: Payer: Self-pay

## 2019-12-21 ENCOUNTER — Encounter (HOSPITAL_COMMUNITY): Payer: Self-pay | Admitting: Emergency Medicine

## 2019-12-21 DIAGNOSIS — R109 Unspecified abdominal pain: Secondary | ICD-10-CM | POA: Insufficient documentation

## 2019-12-21 DIAGNOSIS — Z5321 Procedure and treatment not carried out due to patient leaving prior to being seen by health care provider: Secondary | ICD-10-CM | POA: Insufficient documentation

## 2019-12-21 LAB — COMPREHENSIVE METABOLIC PANEL
ALT: 13 U/L (ref 0–44)
AST: 21 U/L (ref 15–41)
Albumin: 4.1 g/dL (ref 3.5–5.0)
Alkaline Phosphatase: 40 U/L (ref 38–126)
Anion gap: 9 (ref 5–15)
BUN: 5 mg/dL — ABNORMAL LOW (ref 6–20)
CO2: 25 mmol/L (ref 22–32)
Calcium: 8.6 mg/dL — ABNORMAL LOW (ref 8.9–10.3)
Chloride: 105 mmol/L (ref 98–111)
Creatinine, Ser: 0.96 mg/dL (ref 0.61–1.24)
GFR calc Af Amer: >60 mL/min (ref >60)
GFR calc non Af Amer: >60 mL/min (ref >60)
Glucose, Bld: 94 mg/dL (ref 70–99)
Potassium: 4.1 mmol/L (ref 3.5–5.1)
Sodium: 139 mmol/L (ref 135–145)
Total Bilirubin: 0.9 mg/dL (ref 0.3–1.2)
Total Protein: 6.8 g/dL (ref 6.5–8.1)

## 2019-12-21 LAB — CBC
HCT: 44.7 % (ref 39.0–52.0)
Hemoglobin: 14.9 g/dL (ref 13.0–17.0)
MCH: 30 pg (ref 26.0–34.0)
MCHC: 33.3 g/dL (ref 30.0–36.0)
MCV: 89.9 fL (ref 80.0–100.0)
Platelets: 185 10*3/uL (ref 150–400)
RBC: 4.97 MIL/uL (ref 4.22–5.81)
RDW: 13.4 % (ref 11.5–15.5)
WBC: 5.7 10*3/uL (ref 4.0–10.5)
nRBC: 0 % (ref 0.0–0.2)

## 2019-12-21 LAB — LIPASE, BLOOD: Lipase: 55 U/L — ABNORMAL HIGH (ref 11–51)

## 2019-12-21 NOTE — ED Triage Notes (Signed)
Patient c/o abdominal pain x 1 week. States he has hx of pancreatitis and believes it is worsening. Reports nausea and unable to vomit due to nothing on his stomach. Denies any urinary symptoms.

## 2019-12-21 NOTE — ED Notes (Signed)
No answer from lobby when called

## 2019-12-21 NOTE — ED Notes (Signed)
No response when called x 2

## 2019-12-21 NOTE — ED Notes (Signed)
No response when called for room x 3

## 2019-12-23 ENCOUNTER — Other Ambulatory Visit: Payer: Self-pay

## 2019-12-23 ENCOUNTER — Emergency Department (HOSPITAL_COMMUNITY): Payer: Self-pay

## 2019-12-23 ENCOUNTER — Encounter (HOSPITAL_COMMUNITY): Payer: Self-pay | Admitting: Emergency Medicine

## 2019-12-23 ENCOUNTER — Emergency Department (HOSPITAL_COMMUNITY)
Admission: EM | Admit: 2019-12-23 | Discharge: 2019-12-23 | Disposition: A | Discharge status: Home / Self Care | Payer: Self-pay | Attending: Emergency Medicine | Admitting: Emergency Medicine

## 2019-12-23 DIAGNOSIS — Y929 Unspecified place or not applicable: Secondary | ICD-10-CM | POA: Insufficient documentation

## 2019-12-23 DIAGNOSIS — S62644A Nondisplaced fracture of proximal phalanx of right ring finger, initial encounter for closed fracture: Secondary | ICD-10-CM | POA: Insufficient documentation

## 2019-12-23 DIAGNOSIS — Y999 Unspecified external cause status: Secondary | ICD-10-CM | POA: Insufficient documentation

## 2019-12-23 DIAGNOSIS — Z9481 Bone marrow transplant status: Secondary | ICD-10-CM | POA: Insufficient documentation

## 2019-12-23 DIAGNOSIS — Y939 Activity, unspecified: Secondary | ICD-10-CM | POA: Insufficient documentation

## 2019-12-23 DIAGNOSIS — D571 Sickle-cell disease without crisis: Secondary | ICD-10-CM | POA: Insufficient documentation

## 2019-12-23 DIAGNOSIS — Z87891 Personal history of nicotine dependence: Secondary | ICD-10-CM | POA: Insufficient documentation

## 2019-12-23 MED ORDER — IBUPROFEN 800 MG PO TABS: 800.0000 mg | ORAL_TABLET | Freq: Three times a day (TID) | ORAL | 0 refills | Status: DC

## 2019-12-23 MED ORDER — HYDROCODONE-ACETAMINOPHEN 5-325 MG PO TABS
1.0000 | ORAL_TABLET | Freq: Once | ORAL | Status: AC
  Administered 2019-12-23: 1 via ORAL
  Filled 2019-12-23: qty 1

## 2019-12-23 MED ORDER — HYDROCODONE-ACETAMINOPHEN 5-325 MG PO TABS: 2.0000 | ORAL_TABLET | ORAL | 0 refills | Status: DC | PRN

## 2019-12-23 NOTE — Discharge Instructions (Signed)
You need to remain in splint until you follow-up with a hand specialist.  Your x-ray shows a fracture at the base of your fourth finger and a possible fracture with finger.  Take ibuprofen 800 mg every 8 hours, and use prescribed hydrocodone as needed for breakthrough pain.  This can cause drowsiness, do not take before driving.  Ice and elevate the hand as much as possible.

## 2019-12-23 NOTE — ED Triage Notes (Signed)
Deformity noted to index finger and bruising to palmar surface of hand   Sensation intact
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
## 2021-12-07 ENCOUNTER — Emergency Department (HOSPITAL_COMMUNITY): Payer: Self-pay

## 2021-12-07 ENCOUNTER — Emergency Department (HOSPITAL_COMMUNITY)
Admission: EM | Admit: 2021-12-07 | Discharge: 2021-12-08 | Discharge status: Against Medical Advice | Payer: Self-pay | Attending: Emergency Medicine | Admitting: Emergency Medicine

## 2021-12-07 ENCOUNTER — Other Ambulatory Visit: Payer: Self-pay

## 2021-12-07 DIAGNOSIS — Z5321 Procedure and treatment not carried out due to patient leaving prior to being seen by health care provider: Secondary | ICD-10-CM | POA: Insufficient documentation

## 2021-12-07 DIAGNOSIS — W500XXA Accidental hit or strike by another person, initial encounter: Secondary | ICD-10-CM | POA: Insufficient documentation

## 2021-12-07 DIAGNOSIS — S0992XA Unspecified injury of nose, initial encounter: Secondary | ICD-10-CM | POA: Insufficient documentation

## 2021-12-07 DIAGNOSIS — Y9367 Activity, basketball: Secondary | ICD-10-CM | POA: Insufficient documentation
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
## 2021-12-07 NOTE — ED Provider Triage Note (Signed)
Emergency Medicine Provider Triage Evaluation Note ? ?Dustin Franklin , a 41 y.o. male  was evaluated in triage.  Pt complains of possible broken nose.  Patient states that he was outside going basketball with his children earlier when his 66 year old son elbowed him in the nose.  Patient states that both nostrils began bleeding at this time.  Patient believes that his nose is broken. ? ?Review of Systems  ?Positive:  ?Negative:  ? ?Physical Exam  ?BP (!) 152/96 (BP Location: Left Arm)   Pulse 68   Temp 97.9 ?F (36.6 ?C) (Oral)   Resp 16   SpO2 97%  ?Gen:   Awake, no distress   ?Resp:  Normal effort  ?MSK:   Moves extremities without difficulty  ?Other:  Patient has tissue paper in both nostrils.  Patient notices swollen, no obvious deformity. ? ?Medical Decision Making  ?Medically screening exam initiated at 10:04 PM.  Appropriate orders placed.  Dustin Franklin was informed that the remainder of the evaluation will be completed by another provider, this initial triage assessment does not replace that evaluation, and the importance of remaining in the ED until their evaluation is complete. ? ? ?  ?Dustin Decant, PA-C ?12/07/21 2205 ? ?

## 2021-12-08 NOTE — ED Notes (Signed)
Called for vitals x3 °

## 2022-02-02 ENCOUNTER — Ambulatory Visit (HOSPITAL_COMMUNITY)
Admission: EM | Admit: 2022-02-02 | Discharge: 2022-02-02 | Disposition: A | Discharge status: Home / Self Care | Payer: Self-pay | Attending: Student | Admitting: Student

## 2022-02-02 ENCOUNTER — Encounter (HOSPITAL_COMMUNITY): Payer: Self-pay

## 2022-02-02 DIAGNOSIS — Z202 Contact with and (suspected) exposure to infections with a predominantly sexual mode of transmission: Secondary | ICD-10-CM | POA: Insufficient documentation

## 2022-02-02 DIAGNOSIS — Z113 Encounter for screening for infections with a predominantly sexual mode of transmission: Secondary | ICD-10-CM | POA: Insufficient documentation

## 2022-02-02 MED ORDER — METRONIDAZOLE 500 MG PO TABS: 2000.0000 mg | ORAL_TABLET | Freq: Once | ORAL | 0 refills | Status: AC

## 2022-02-02 MED ORDER — DOXYCYCLINE HYCLATE 100 MG PO CAPS: 100.0000 mg | ORAL_CAPSULE | Freq: Two times a day (BID) | ORAL | 0 refills | Status: AC

## 2022-02-02 NOTE — ED Provider Notes (Signed)
[facility]    CSN: [id number] Arrival date & time: 02/02/22  8756      History   Chief Complaint Chief Complaint  Patient presents with   Exposure to STD    HPI Dustin Franklin is a 41 y.o. male presenting with exposure to chlamydia and trichomonas - no symptoms. History sickle cell anemia. Describes recent unprotected intercourse with new male partner who is chlamydia and trichomonas positive. Denies hematuria, dysuria, frequency, urgency, back pain, n/v/d/abd pain, fevers/chills, abdnormal penile discharge, penile or testicular pain, penile or testicular rash or lesion.    HPI  Past Medical History:  Diagnosis Date   Bronchitis    Hx of bone transplant    for sickle cell   Pancreatitis    Sickle cell anemia (HCC)     There are no problems to display for this patient.   History reviewed. No pertinent surgical history.     Home Medications    Prior to Admission medications   Medication Sig Start Date End Date Taking? Authorizing Provider  doxycycline (VIBRAMYCIN) 100 MG capsule Take 1 capsule (100 mg total) by mouth 2 (two) times daily for 7 days. 02/02/22 02/09/22 Yes Rhys Martini, PA-C  metroNIDAZOLE (FLAGYL) 500 MG tablet Take 4 tablets (2,000 mg total) by mouth once for 1 dose. Avoid alcohol while taking this medication and for 2 days after 02/02/22 02/02/22 Yes Rhys Martini, PA-C  acetaminophen (TYLENOL) 325 MG tablet Take 650 mg by mouth every 6 (six) hours as needed for mild pain.    [provider]  albuterol (VENTOLIN HFA) 108 (90 Base) MCG/ACT inhaler Inhale 2 puffs into the lungs every 4 (four) hours as needed for wheezing or shortness of breath. 05/15/19 06/14/19  Jeannie Fend, PA-C  ferrous sulfate 325 (65 FE) MG tablet Take 325 mg by mouth daily with breakfast.    [provider]  folic acid (FOLVITE) 1 MG tablet Take 1 mg by mouth daily.    [provider]  HYDROcodone-acetaminophen (NORCO/VICODIN) 5-325 MG  tablet Take 2 tablets by mouth every 4 (four) hours as needed. 12/23/19   Dartha Lodge, PA-C  hydroxyurea (HYDREA) 500 MG capsule Take 500 mg by mouth daily. May take with food to minimize GI side effects.    [provider]  ibuprofen (ADVIL) 800 MG tablet Take 1 tablet (800 mg total) by mouth 3 (three) times daily. 12/23/19   Dartha Lodge, PA-C  ondansetron (ZOFRAN ODT) 4 MG disintegrating tablet Take 1 tablet (4 mg total) by mouth every 8 (eight) hours as needed for nausea or vomiting. 11/12/19   Milagros Loll, MD    Family History History reviewed. No pertinent family history.  Social History Social History   Tobacco Use   Smoking status: Former   Smokeless tobacco: Never  Substance Use Topics   Alcohol use: Not Currently    Alcohol/week: 12.0 standard drinks of alcohol    Types: 12 Cans of beer per week    Comment: in last 2 wks due to pancreatitis   Drug use: Yes    Types: Marijuana     Allergies   Beef-derived products, Eggs or egg-derived products, Iodine, Milk-related compounds, and Peanut-containing drug products   Review of Systems Review of Systems  Constitutional:  Negative for chills and fever.  HENT:  Negative for sore throat.   Eyes:  Negative for pain and redness.  Respiratory:  Negative for shortness of breath.   Cardiovascular:  Negative for chest pain.  Gastrointestinal:  Negative for abdominal pain, diarrhea, nausea and vomiting.  Genitourinary:  Negative for decreased urine volume, difficulty urinating, dysuria, flank pain, frequency, genital sores, hematuria and urgency.  Musculoskeletal:  Negative for back pain.  Skin:  Negative for rash.  All other systems reviewed and are negative.    Physical Exam Triage Vital Signs ED Triage Vitals  Enc Vitals Group     BP 02/02/22 0809 (!) 144/80     Pulse Rate 02/02/22 0809 66     Resp 02/02/22 0809 14     Temp 02/02/22 0809 98.2 F (36.8 C)     Temp Source 02/02/22 0809 Oral     SpO2  02/02/22 0809 95 %     Weight --      Height --      Head Circumference --      Peak Flow --      Pain Score 02/02/22 0811 0     Pain Loc --      Pain Edu? --      Excl. in [place]? --    No data found.  Updated Vital Signs BP (!) 144/80 (BP Location: Right Arm)   Pulse 66   Temp 98.2 F (36.8 C) (Oral)   Resp 14   SpO2 95%   Visual Acuity Right Eye Distance:   Left Eye Distance:   Bilateral Distance:    Right Eye Near:   Left Eye Near:    Bilateral Near:     Physical Exam Vitals reviewed.  Constitutional:      General: He is not in acute distress.    Appearance: Normal appearance. He is not ill-appearing.  HENT:     Head: Normocephalic and atraumatic.     Mouth/Throat:     Mouth: Mucous membranes are moist.     Comments: Moist mucous membranes Eyes:     Extraocular Movements: Extraocular movements intact.     Pupils: Pupils are equal, round, and reactive to light.  Cardiovascular:     Rate and Rhythm: Normal rate and regular rhythm.     Heart sounds: Normal heart sounds.  Pulmonary:     Effort: Pulmonary effort is normal.     Breath sounds: Normal breath sounds. No wheezing, rhonchi or rales.  Abdominal:     General: Bowel sounds are normal. There is no distension.     Palpations: Abdomen is soft. There is no mass.     Tenderness: There is no abdominal tenderness. There is no right CVA tenderness, left CVA tenderness, guarding or rebound.  Skin:    General: Skin is warm.     Capillary Refill: Capillary refill takes less than 2 seconds.     Comments: Good skin turgor  Neurological:     General: No focal deficit present.     Mental Status: He is alert and oriented to person, place, and time.  Psychiatric:        Mood and Affect: Mood normal.        Behavior: Behavior normal.      UC Treatments / Results  Labs (all labs ordered are listed, but only abnormal results are displayed) Labs Reviewed  CYTOLOGY, (ORAL, ANAL, URETHRAL) ANCILLARY ONLY     EKG   Radiology No results found.  Procedures Procedures (including critical care time)  Medications Ordered in UC Medications - No data to display  Initial Impression / Assessment and Plan / UC Course  I have reviewed the triage vital  signs and the nursing notes.  Pertinent labs & imaging results that were available during my care of the patient were reviewed by me and considered in my medical decision making (see chart for details).     This patient is a very pleasant 41 y.o. year old male presenting with exposure to chlamydia and trichomonas. Afebrile, nontachycardic, no reproducible abd pain or CVAT.  Will send self-swab for G/C, trich. Declines HIV, RPR. Safe sex precautions.   Doxycycline and metronidazole sent.   ED return precautions discussed. Patient verbalizes understanding and agreement.   Final Clinical Impressions(s) / UC Diagnoses   Final diagnoses:  Routine screening for STI (sexually transmitted infection)  Exposure to chlamydia     Discharge Instructions      -Doxycycline twice daily for 7 days.  Make sure to wear sunscreen while spending time outside while on this medication as it can increase your chance of sunburn. You can take this medication with food if you have a sensitive stomach. -Metronidazole four pills taken at the same time. Avoid alcohol for at least 24 hours after taking -Abstain from intercourse until treatment is complete    ED Prescriptions     Medication Sig Dispense Auth. Provider   doxycycline (VIBRAMYCIN) 100 MG capsule Take 1 capsule (100 mg total) by mouth 2 (two) times daily for 7 days. 14 capsule Rhys Martini, PA-C   metroNIDAZOLE (FLAGYL) 500 MG tablet Take 4 tablets (2,000 mg total) by mouth once for 1 dose. Avoid alcohol while taking this medication and for 2 days after 4 tablet Rhys Martini, PA-C      PDMP not reviewed this encounter.   Rhys Martini, PA-C 02/02/22 [phone]

## 2022-02-03 LAB — CYTOLOGY, (ORAL, ANAL, URETHRAL) ANCILLARY ONLY
Chlamydia: POSITIVE — AB
Comment: NEGATIVE
Comment: NORMAL
Neisseria Gonorrhea: NEGATIVE

## 2022-05-19 ENCOUNTER — Ambulatory Visit (INDEPENDENT_AMBULATORY_CARE_PROVIDER_SITE_OTHER): Payer: Self-pay

## 2022-05-19 ENCOUNTER — Ambulatory Visit (HOSPITAL_COMMUNITY)
Admission: EM | Admit: 2022-05-19 | Discharge: 2022-05-19 | Disposition: A | Discharge status: Home / Self Care | Payer: Self-pay | Attending: Physician Assistant | Admitting: Physician Assistant

## 2022-05-19 ENCOUNTER — Encounter (HOSPITAL_COMMUNITY): Payer: Self-pay

## 2022-05-19 DIAGNOSIS — R059 Cough, unspecified: Secondary | ICD-10-CM

## 2022-05-19 DIAGNOSIS — R058 Other specified cough: Secondary | ICD-10-CM

## 2022-05-19 DIAGNOSIS — J40 Bronchitis, not specified as acute or chronic: Secondary | ICD-10-CM

## 2022-05-19 DIAGNOSIS — J329 Chronic sinusitis, unspecified: Secondary | ICD-10-CM

## 2022-05-19 MED ORDER — ALBUTEROL SULFATE HFA 108 (90 BASE) MCG/ACT IN AERS
2.0000 | INHALATION_SPRAY | Freq: Once | RESPIRATORY_TRACT | Status: AC
  Administered 2022-05-19: 2 via RESPIRATORY_TRACT

## 2022-05-19 MED ORDER — ALBUTEROL SULFATE HFA 108 (90 BASE) MCG/ACT IN AERS
INHALATION_SPRAY | RESPIRATORY_TRACT | Status: AC
  Filled 2022-05-19: qty 6.7

## 2022-05-19 MED ORDER — AMOXICILLIN-POT CLAVULANATE 875-125 MG PO TABS: 1.0000 | ORAL_TABLET | Freq: Two times a day (BID) | ORAL | 0 refills | Status: DC

## 2022-05-19 NOTE — ED Provider Notes (Signed)
[place]    CSN: [id number] Arrival date & time: 05/19/22  [id number]      History   Chief Complaint Chief Complaint  Patient presents with   Cough   Chest congestion    HPI Dustin Franklin is a 41 y.o. male.   Patient presents today with a 2 to 3-week history of cough.  Reports initially he had nasal congestion and typical URI symptoms but these have improved and he continues to have severe cough.  He has a history of bronchitis but denies history of asthma.  He smokes marijuana but does not smoke cigarettes.  He does have a history of sickle cell.  He denies any known sick contacts.  Reports that his cough is worsening prompting evaluation today.  He has had pneumonia multiple times in the past and is concerned he might be developing this now.  Denies any recent antibiotic or steroid use.  He has been using albuterol with temporary improvement of symptoms.  He is having difficulty with daily activities as result of symptoms.    Past Medical History:  Diagnosis Date   Bronchitis    Hx of bone transplant    for sickle cell   Pancreatitis    Sickle cell anemia (HCC)     There are no problems to display for this patient.   History reviewed. No pertinent surgical history.     Home Medications    Prior to Admission medications   Medication Sig Start Date End Date Taking? Authorizing Provider  albuterol (VENTOLIN HFA) 108 (90 Base) MCG/ACT inhaler Inhale 2 puffs into the lungs every 4 (four) hours as needed for wheezing or shortness of breath. 05/15/19 05/19/22 Yes Tacy Learn, PA-C  amoxicillin-clavulanate (AUGMENTIN) 875-125 MG tablet Take 1 tablet by mouth every 12 (twelve) hours. 05/19/22  Yes Genasis Zingale K, PA-C  ferrous sulfate 325 (65 FE) MG tablet Take 325 mg by mouth daily with breakfast.   Yes [provider]  folic acid (FOLVITE) 1 MG tablet Take 1 mg by mouth daily.   Yes [provider]  hydroxyurea (HYDREA) 500 MG capsule Take  500 mg by mouth daily. May take with food to minimize GI side effects.   Yes [provider]  acetaminophen (TYLENOL) 325 MG tablet Take 650 mg by mouth every 6 (six) hours as needed for mild pain.    [provider]  HYDROcodone-acetaminophen (NORCO/VICODIN) 5-325 MG tablet Take 2 tablets by mouth every 4 (four) hours as needed. 12/23/19   Jacqlyn Larsen, PA-C  ibuprofen (ADVIL) 800 MG tablet Take 1 tablet (800 mg total) by mouth 3 (three) times daily. 12/23/19   Jacqlyn Larsen, PA-C  ondansetron (ZOFRAN ODT) 4 MG disintegrating tablet Take 1 tablet (4 mg total) by mouth every 8 (eight) hours as needed for nausea or vomiting. 11/12/19   Lucrezia Starch, MD    Family History History reviewed. No pertinent family history.  Social History Social History   Tobacco Use   Smoking status: Former   Smokeless tobacco: Never  Substance Use Topics   Alcohol use: Not Currently    Alcohol/week: 12.0 standard drinks of alcohol    Types: 12 Cans of beer per week    Comment: in last 2 wks due to pancreatitis   Drug use: Yes    Types: Marijuana     Allergies   Beef-derived products, Eggs or egg-derived products, Iodine, Milk-related compounds, and Peanut-containing drug products   Review of Systems  Review of Systems  Constitutional:  Positive for activity change. Negative for appetite change, fatigue and fever.  HENT:  Positive for congestion and sore throat. Negative for sinus pressure and sneezing.   Respiratory:  Positive for cough and shortness of breath.   Cardiovascular:  Negative for chest pain.  Gastrointestinal:  Negative for abdominal pain, diarrhea, nausea and vomiting.  Neurological:  Negative for dizziness, light-headedness and headaches.     Physical Exam Triage Vital Signs ED Triage Vitals  Enc Vitals Group     BP 05/19/22 1029 (!) 162/69     Pulse Rate 05/19/22 1029 (!) 59     Resp 05/19/22 1029 16     Temp 05/19/22 1029 98.4 F (36.9 C)     Temp  Source 05/19/22 1029 Oral     SpO2 05/19/22 1029 98 %     Weight --      Height --      Head Circumference --      Peak Flow --      Pain Score 05/19/22 1032 7     Pain Loc --      Pain Edu? --      Excl. in [place]? --    No data found.  Updated Vital Signs BP (!) 162/69 (BP Location: Left Arm)   Pulse (!) 59   Temp 98.4 F (36.9 C) (Oral)   Resp 16   SpO2 98%   Visual Acuity Right Eye Distance:   Left Eye Distance:   Bilateral Distance:    Right Eye Near:   Left Eye Near:    Bilateral Near:     Physical Exam Vitals reviewed.  Constitutional:      General: He is awake.     Appearance: Normal appearance. He is well-developed. He is not ill-appearing.     Comments: Very pleasant male appears stated age in no acute distress sitting comfortably in exam room  HENT:     Head: Normocephalic and atraumatic.     Right Ear: Tympanic membrane, ear canal and external ear normal. Tympanic membrane is not erythematous or bulging.     Left Ear: Tympanic membrane, ear canal and external ear normal. Tympanic membrane is not erythematous or bulging.     Nose: Nose normal.     Mouth/Throat:     Pharynx: Uvula midline. No oropharyngeal exudate or posterior oropharyngeal erythema.  Cardiovascular:     Rate and Rhythm: Normal rate and regular rhythm.     Heart sounds: Normal heart sounds, S1 normal and S2 normal. No murmur heard. Pulmonary:     Effort: Pulmonary effort is normal. No accessory muscle usage or respiratory distress.     Breath sounds: No stridor. Wheezing present. No rhonchi or rales.     Comments: Wheezing and reactive cough with deep breathing Neurological:     Mental Status: He is alert.  Psychiatric:        Behavior: Behavior is cooperative.      UC Treatments / Results  Labs (all labs ordered are listed, but only abnormal results are displayed) Labs Reviewed - No data to display  EKG   Radiology DG Chest 2 View  Result Date: 05/19/2022 CLINICAL DATA:   Cough EXAM: CHEST - 2 VIEW COMPARISON:  Chest x-ray dated May 15, 2019 FINDINGS: The heart size and mediastinal contours are within normal limits. Both lungs are clear. The visualized skeletal structures are unremarkable. IMPRESSION: No active cardiopulmonary disease. Electronically Signed   By: Hosie Poisson.D.  On: 05/19/2022 11:26    Procedures Procedures (including critical care time)  Medications Ordered in UC Medications  albuterol (VENTOLIN HFA) 108 (90 Base) MCG/ACT inhaler 2 puff (2 puffs Inhalation Given 05/19/22 1118)    Initial Impression / Assessment and Plan / UC Course  I have reviewed the triage vital signs and the nursing notes.  Pertinent labs & imaging results that were available during my care of the patient were reviewed by me and considered in my medical decision making (see chart for details).     Patient is well-appearing, afebrile, nontoxic, nontachycardic.  He did have significant wheezing on exam which resolved with albuterol in clinic.  Prednisone was deferred as patient had improvement with albuterol alone and there was concern for systemic steroids causing vaso-occlusive events.  Will cover for secondary infection given prolonged and worsening symptoms.  He was started on Augmentin.  Chest x-ray was obtained which showed no acute cardiopulmonary disease.  Low suspicion for acute chest syndrome as patient had normal x-ray, denies significant chest pain, is afebrile.  Discussed that he should follow-up with his primary care first thing next week for reevaluation.  If he has any worsening symptoms including high fever, chest pain, shortness of breath, weakness, nausea, vomiting, widespread pain he needs to go to the emergency room immediately for further evaluation and management.  Strict return precautions given.  Work excuse note provided.  Final Clinical Impressions(s) / UC Diagnoses   Final diagnoses:  Sinobronchitis  Productive cough     Discharge  Instructions      Your chest x-ray was normal.  Please start Augmentin twice daily.  Use your albuterol inhaler every 4-6 hours as needed for shortness of breath.  Use over-the-counter medications for additional symptom relief.  If your symptoms are worsening anyone you develop chest pain, shortness of breath, widespread pain, nausea, vomiting, weakness you need to go to the emergency room immediately.  Follow-up with your primary care provider early next week.     ED Prescriptions     Medication Sig Dispense Auth. Provider   amoxicillin-clavulanate (AUGMENTIN) 875-125 MG tablet Take 1 tablet by mouth every 12 (twelve) hours. 14 tablet Dot Splinter, Derry Skill, PA-C      PDMP not reviewed this encounter.   Terrilee Croak, PA-C 05/19/22 1139

## 2022-05-19 NOTE — Discharge Instructions (Signed)
Your chest x-ray was normal.  Please start Augmentin twice daily.  Use your albuterol inhaler every 4-6 hours as needed for shortness of breath.  Use over-the-counter medications for additional symptom relief.  If your symptoms are worsening anyone you develop chest pain, shortness of breath, widespread pain, nausea, vomiting, weakness you need to go to the emergency room immediately.  Follow-up with your primary care provider early next week.

## 2022-05-19 NOTE — ED Triage Notes (Signed)
Patient having fatigue, body aches and congestion (nasal/chest). Onset the end of September. Productive cough with dark green and brown coloration.  Patient has sickle cell so he is prone to Pneumonia.  Patient states his girl friend is sick as well but he was sick first.

## 2022-09-10 ENCOUNTER — Emergency Department (HOSPITAL_COMMUNITY)
Admission: EM | Admit: 2022-09-10 | Discharge: 2022-09-10 | Disposition: A | Discharge status: Home / Self Care | Payer: Medicaid Other | Attending: Emergency Medicine | Admitting: Emergency Medicine

## 2022-09-10 ENCOUNTER — Emergency Department (HOSPITAL_COMMUNITY): Payer: Medicaid Other

## 2022-09-10 ENCOUNTER — Other Ambulatory Visit: Payer: Self-pay

## 2022-09-10 DIAGNOSIS — R0789 Other chest pain: Secondary | ICD-10-CM | POA: Diagnosis not present

## 2022-09-10 DIAGNOSIS — Z91010 Allergy to peanuts: Secondary | ICD-10-CM | POA: Insufficient documentation

## 2022-09-10 DIAGNOSIS — D5700 Hb-SS disease with crisis, unspecified: Secondary | ICD-10-CM | POA: Diagnosis not present

## 2022-09-10 DIAGNOSIS — R079 Chest pain, unspecified: Secondary | ICD-10-CM | POA: Diagnosis not present

## 2022-09-10 DIAGNOSIS — I10 Essential (primary) hypertension: Secondary | ICD-10-CM | POA: Diagnosis not present

## 2022-09-10 LAB — CBC WITH DIFFERENTIAL/PLATELET
Abs Immature Granulocytes: 0.03 10*3/uL (ref 0.00–0.07)
Basophils Absolute: 0 10*3/uL (ref 0.0–0.1)
Basophils Relative: 0 %
Eosinophils Absolute: 0 10*3/uL (ref 0.0–0.5)
Eosinophils Relative: 0 %
HCT: 42.5 % (ref 39.0–52.0)
Hemoglobin: 14 g/dL (ref 13.0–17.0)
Immature Granulocytes: 0 %
Lymphocytes Relative: 16 %
Lymphs Abs: 1.4 10*3/uL (ref 0.7–4.0)
MCH: 29 pg (ref 26.0–34.0)
MCHC: 32.9 g/dL (ref 30.0–36.0)
MCV: 88 fL (ref 80.0–100.0)
Monocytes Absolute: 0.6 10*3/uL (ref 0.1–1.0)
Monocytes Relative: 6 %
Neutro Abs: 7 10*3/uL (ref 1.7–7.7)
Neutrophils Relative %: 78 %
Platelets: 213 10*3/uL (ref 150–400)
RBC: 4.83 MIL/uL (ref 4.22–5.81)
RDW: 13.6 % (ref 11.5–15.5)
WBC: 9.1 10*3/uL (ref 4.0–10.5)
nRBC: 0 % (ref 0.0–0.2)

## 2022-09-10 LAB — RETICULOCYTES
Immature Retic Fract: 4.4 % (ref 2.3–15.9)
RBC.: 4.7 MIL/uL (ref 4.22–5.81)
Retic Count, Absolute: 39.5 10*3/uL (ref 19.0–186.0)
Retic Ct Pct: 0.8 % (ref 0.4–3.1)

## 2022-09-10 LAB — BASIC METABOLIC PANEL
Anion gap: 9 (ref 5–15)
BUN: 11 mg/dL (ref 6–20)
CO2: 23 mmol/L (ref 22–32)
Calcium: 8.9 mg/dL (ref 8.9–10.3)
Chloride: 108 mmol/L (ref 98–111)
Creatinine, Ser: 1.11 mg/dL (ref 0.61–1.24)
GFR, Estimated: >60 mL/min (ref >60)
Glucose, Bld: 93 mg/dL (ref 70–99)
Potassium: 3.5 mmol/L (ref 3.5–5.1)
Sodium: 140 mmol/L (ref 135–145)

## 2022-09-10 LAB — TROPONIN I (HIGH SENSITIVITY): Troponin I (High Sensitivity): 3 ng/L (ref <18)

## 2022-09-10 MED ORDER — KETOROLAC TROMETHAMINE 15 MG/ML IJ SOLN
15.0000 mg | INTRAMUSCULAR | Status: AC
  Administered 2022-09-10: 15 mg via INTRAVENOUS
  Filled 2022-09-10: qty 1

## 2022-09-10 MED ORDER — HYDROMORPHONE HCL 1 MG/ML IJ SOLN: 1.0000 mg | INTRAMUSCULAR | Status: DC

## 2022-09-10 MED ORDER — LIDOCAINE VISCOUS HCL 2 % MT SOLN
15.0000 mL | Freq: Once | OROMUCOSAL | Status: AC
  Administered 2022-09-10: 15 mL via OROMUCOSAL
  Filled 2022-09-10: qty 15

## 2022-09-10 MED ORDER — HYDROMORPHONE HCL 1 MG/ML IJ SOLN
0.5000 mg | INTRAMUSCULAR | Status: AC
  Administered 2022-09-10: 0.5 mg via INTRAVENOUS
  Filled 2022-09-10: qty 1

## 2022-09-10 MED ORDER — ALUM & MAG HYDROXIDE-SIMETH 200-200-20 MG/5ML PO SUSP
30.0000 mL | Freq: Once | ORAL | Status: AC
  Administered 2022-09-10: 30 mL via ORAL
  Filled 2022-09-10: qty 30

## 2022-09-10 NOTE — Discharge Instructions (Addendum)
As we discussed, your x-ray is normal.  Your lab work also is reassuring.  You currently do not have obvious anemia so I would like you to follow-up with the hematologist on these papers.  Her name is with [place].  The other offices on these papers are [facility].  Do not hesitate to return with any worsening symptoms, especially worsening pain, shortness of breath, dizziness or loss of consciousness.  You may take Tylenol for your discomfort.  It was a pleasure to meet you and we hope you feel better!
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
## 2022-09-21 DIAGNOSIS — M25541 Pain in joints of right hand: Secondary | ICD-10-CM | POA: Diagnosis not present

## 2022-09-21 DIAGNOSIS — M25542 Pain in joints of left hand: Secondary | ICD-10-CM | POA: Diagnosis not present

## 2022-09-21 DIAGNOSIS — E559 Vitamin D deficiency, unspecified: Secondary | ICD-10-CM | POA: Diagnosis not present

## 2022-09-21 DIAGNOSIS — Z1159 Encounter for screening for other viral diseases: Secondary | ICD-10-CM | POA: Diagnosis not present

## 2022-09-21 DIAGNOSIS — Z136 Encounter for screening for cardiovascular disorders: Secondary | ICD-10-CM | POA: Diagnosis not present

## 2022-09-21 DIAGNOSIS — D571 Sickle-cell disease without crisis: Secondary | ICD-10-CM | POA: Insufficient documentation

## 2022-09-21 DIAGNOSIS — Z6825 Body mass index (BMI) 25.0-25.9, adult: Secondary | ICD-10-CM | POA: Diagnosis not present

## 2022-09-21 DIAGNOSIS — K859 Acute pancreatitis without necrosis or infection, unspecified: Secondary | ICD-10-CM | POA: Insufficient documentation

## 2022-09-21 DIAGNOSIS — Z131 Encounter for screening for diabetes mellitus: Secondary | ICD-10-CM | POA: Diagnosis not present

## 2023-01-10 ENCOUNTER — Ambulatory Visit (HOSPITAL_COMMUNITY)
Admission: EM | Admit: 2023-01-10 | Discharge: 2023-01-10 | Disposition: A | Discharge status: Home / Self Care | Payer: Medicaid Other | Attending: Internal Medicine | Admitting: Internal Medicine

## 2023-01-10 ENCOUNTER — Encounter (HOSPITAL_COMMUNITY): Payer: Self-pay

## 2023-01-10 DIAGNOSIS — Z113 Encounter for screening for infections with a predominantly sexual mode of transmission: Secondary | ICD-10-CM | POA: Insufficient documentation

## 2023-01-10 DIAGNOSIS — M549 Dorsalgia, unspecified: Secondary | ICD-10-CM | POA: Diagnosis not present

## 2023-01-10 LAB — POCT URINALYSIS DIP (MANUAL ENTRY)
Bilirubin, UA: NEGATIVE
Blood, UA: NEGATIVE
Glucose, UA: NEGATIVE mg/dL
Ketones, POC UA: NEGATIVE mg/dL
Nitrite, UA: NEGATIVE
Protein Ur, POC: NEGATIVE mg/dL
Spec Grav, UA: 1.025 (ref 1.010–1.025)
Urobilinogen, UA: 1 E.U./dL
pH, UA: 7 (ref 5.0–8.0)

## 2023-01-10 LAB — HIV ANTIBODY (ROUTINE TESTING W REFLEX): HIV Screen 4th Generation wRfx: NONREACTIVE

## 2023-01-10 MED ORDER — METHOCARBAMOL 500 MG PO TABS: 500.0000 mg | ORAL_TABLET | Freq: Every evening | ORAL | 0 refills | Status: AC | PRN

## 2023-01-10 MED ORDER — IBUPROFEN 600 MG PO TABS: 600.0000 mg | ORAL_TABLET | Freq: Four times a day (QID) | ORAL | 0 refills | Status: DC | PRN

## 2023-01-10 NOTE — ED Triage Notes (Signed)
Pt presents for STD testing and lower back pain. Reports his recent partner tested positive for an STD pt unaware of which one.

## 2023-01-10 NOTE — ED Provider Notes (Signed)
[facility]    CSN: [id number] Arrival date & time: 01/10/23  1500      History   Chief Complaint Chief Complaint  Patient presents with   SEXUALLY TRANSMITTED DISEASE    HPI Dustin Franklin is a 42 y.o. male comes to the urgent care with left-sided mid back pain of several days duration.  Patient describes the pain as throbbing with no known relieving factors.  He denies any trauma to the back, heavy lifting or falls.  Pain does not radiate to the groin.  No numbness or tingling of the lower extremities.  No weakness in both lower extremities  Patient will like to be screened for STD.  Patient's partner has been diagnosed with STD recently.  He denies any dysuria, urgency or frequency.   HPI  Past Medical History:  Diagnosis Date   Bronchitis    Hx of bone transplant    for sickle cell   Pancreatitis    Sickle cell anemia (HCC)     There are no problems to display for this patient.   History reviewed. No pertinent surgical history.     Home Medications    Prior to Admission medications   Medication Sig Start Date End Date Taking? Authorizing Provider  ibuprofen (ADVIL) 600 MG tablet Take 1 tablet (600 mg total) by mouth every 6 (six) hours as needed. 01/10/23  Yes Vonya Ohalloran, Britta Mccreedy, MD  methocarbamol (ROBAXIN) 500 MG tablet Take 1 tablet (500 mg total) by mouth at bedtime as needed for muscle spasms. 01/10/23  Yes Tuwanda Vokes, Britta Mccreedy, MD  acetaminophen (TYLENOL) 325 MG tablet Take 650 mg by mouth every 6 (six) hours as needed for mild pain.    [provider]  albuterol (VENTOLIN HFA) 108 (90 Base) MCG/ACT inhaler Inhale 2 puffs into the lungs every 4 (four) hours as needed for wheezing or shortness of breath. 05/15/19 05/19/22  Jeannie Fend, PA-C  amoxicillin-clavulanate (AUGMENTIN) 875-125 MG tablet Take 1 tablet by mouth every 12 (twelve) hours. 05/19/22   Raspet, Noberto Retort, PA-C  ferrous sulfate 325 (65 FE) MG tablet Take 325 mg by mouth daily with  breakfast.    [provider]  folic acid (FOLVITE) 1 MG tablet Take 1 mg by mouth daily.    [provider]  HYDROcodone-acetaminophen (NORCO/VICODIN) 5-325 MG tablet Take 2 tablets by mouth every 4 (four) hours as needed. 12/23/19   Dartha Lodge, PA-C  hydroxyurea (HYDREA) 500 MG capsule Take 500 mg by mouth daily. May take with food to minimize GI side effects.    [provider]  ondansetron (ZOFRAN ODT) 4 MG disintegrating tablet Take 1 tablet (4 mg total) by mouth every 8 (eight) hours as needed for nausea or vomiting. 11/12/19   Milagros Loll, MD    Family History History reviewed. No pertinent family history.  Social History Social History   Tobacco Use   Smoking status: Former   Smokeless tobacco: Never  Substance Use Topics   Alcohol use: Not Currently    Alcohol/week: 12.0 standard drinks of alcohol    Types: 12 Cans of beer per week    Comment: in last 2 wks due to pancreatitis   Drug use: Yes    Types: Marijuana     Allergies   Beef-derived products, Egg-derived products, Iodine, Milk-related compounds, and Peanut-containing drug products   Review of Systems Review of Systems As per HPI  Physical Exam Triage Vital Signs ED Triage Vitals [01/10/23 1539]  Enc Vitals Group     BP 129/84     Pulse Rate 81     Resp 18     Temp 98.6 F (37 C)     Temp Source Oral     SpO2 97 %     Weight      Height      Head Circumference      Peak Flow      Pain Score 7     Pain Loc      Pain Edu?      Excl. in GC?    No data found.  Updated Vital Signs BP 129/84 (BP Location: Left Arm)   Pulse 81   Temp 98.6 F (37 C) (Oral)   Resp 18   SpO2 97%   Visual Acuity Right Eye Distance:   Left Eye Distance:   Bilateral Distance:    Right Eye Near:   Left Eye Near:    Bilateral Near:     Physical Exam Vitals and nursing note reviewed.  Constitutional:      General: He is not in acute distress.    Appearance: He is not  ill-appearing.  Cardiovascular:     Rate and Rhythm: Normal rate and regular rhythm.  Musculoskeletal:     Comments: Tenderness on palpation of the left paraspinal muscle in the lumbar region.  No CVA tenderness.  Full range of motion of the lumbar spine.  Neurological:     Mental Status: He is alert.      UC Treatments / Results  Labs (all labs ordered are listed, but only abnormal results are displayed) Labs Reviewed  POCT URINALYSIS DIP (MANUAL ENTRY) - Abnormal; Notable for the following components:      Result Value   Leukocytes, UA Trace (*)    All other components within normal limits  HIV ANTIBODY (ROUTINE TESTING W REFLEX)  RPR  CYTOLOGY, (ORAL, ANAL, URETHRAL) ANCILLARY ONLY    EKG   Radiology No results found.  Procedures Procedures (including critical care time)  Medications Ordered in UC Medications - No data to display  Initial Impression / Assessment and Plan / UC Course  I have reviewed the triage vital signs and the nursing notes.  Pertinent labs & imaging results that were available during my care of the patient were reviewed by me and considered in my medical decision making (see chart for details).     1.  Mid back pain on the left side: This is musculoskeletal in nature. Ibuprofen 600 mg every 6 hours as needed for pain Robaxin 500 mg at bedtime as needed Medication precautions given Heating pad use only 20 minutes on-20 minutes off cycle Return precautions given  2.  STD screening: Cytology for GC/chlamydia/trichomonas Safe sex practices advised Return precautions given Will call patient with recommendations if labs are abnormal. Final Clinical Impressions(s) / UC Diagnoses   Final diagnoses:  Mid back pain on left side  Screen for STD (sexually transmitted disease)     Discharge Instructions      Gentle range of motion exercises Take medications as prescribed Heating pad use only 20 minutes on-20 minutes off cycle. Please do  not drive or operate heavy machinery after taking muscle relaxants because the relaxants would make you drowsy. Will call you with recommendations if labs are abnormal Please abstain from sexual intercourse until lab results are available Please return to urgent care if symptoms worsen      ED Prescriptions  Medication Sig Dispense Auth. Provider   ibuprofen (ADVIL) 600 MG tablet Take 1 tablet (600 mg total) by mouth every 6 (six) hours as needed. 30 tablet Kurtiss Wence, Britta Mccreedy, MD   methocarbamol (ROBAXIN) 500 MG tablet Take 1 tablet (500 mg total) by mouth at bedtime as needed for muscle spasms. 10 tablet Ilham Roughton, Britta Mccreedy, MD      PDMP not reviewed this encounter.   Merrilee Jansky, MD 01/10/23 [phone]

## 2023-01-10 NOTE — Discharge Instructions (Addendum)
Gentle range of motion exercises Take medications as prescribed Heating pad use only 20 minutes on-20 minutes off cycle. Please do not drive or operate heavy machinery after taking muscle relaxants because the relaxants would make you drowsy. Will call you with recommendations if labs are abnormal Please abstain from sexual intercourse until lab results are available Please return to urgent care if symptoms worsen

## 2023-01-11 LAB — RPR: RPR Ser Ql: NONREACTIVE

## 2023-01-11 LAB — CYTOLOGY, (ORAL, ANAL, URETHRAL) ANCILLARY ONLY
Chlamydia: NEGATIVE
Comment: NEGATIVE
Comment: NEGATIVE
Comment: NORMAL
Neisseria Gonorrhea: NEGATIVE
Trichomonas: NEGATIVE

## 2023-01-16 ENCOUNTER — Ambulatory Visit
Admission: EM | Admit: 2023-01-16 | Discharge: 2023-01-16 | Disposition: A | Discharge status: Home / Self Care | Payer: Medicaid Other | Attending: Family Medicine | Admitting: Family Medicine

## 2023-01-16 DIAGNOSIS — L239 Allergic contact dermatitis, unspecified cause: Secondary | ICD-10-CM | POA: Diagnosis not present

## 2023-01-16 MED ORDER — PREDNISONE 20 MG PO TABS: 40.0000 mg | ORAL_TABLET | Freq: Every day | ORAL | 0 refills | Status: AC

## 2023-01-16 NOTE — Discharge Instructions (Addendum)
Take prednisone 20 mg--2 daily for 5 days  Take Benadryl or Zyrtec as needed for the itching

## 2023-01-16 NOTE — ED Triage Notes (Signed)
Pt reports he has a rash all over his body x 2 days. States it burns, itches, and is painful.  Pain comes in waves. Pt states he  didn't have any direct contact with [place] but his dogs did.  Applied benadryl cream

## 2023-01-16 NOTE — ED Provider Notes (Signed)
[name]    CSN: [id number] Arrival date & time: 01/16/23  0941      History   Chief Complaint No chief complaint on file.   HPI Dustin Franklin is a 42 y.o. male.   HPI Here for rash and itching.  Started 2 days ago, first on his right neck.  Now it is on his entire back and on his abdomen and chest also. No shortness of breath or fever.  He states it hurts sometimes    Past Medical History:  Diagnosis Date   Bronchitis    Hx of bone transplant    for sickle cell   Pancreatitis    Sickle cell anemia (HCC)     There are no problems to display for this patient.   History reviewed. No pertinent surgical history.     Home Medications    Prior to Admission medications   Medication Sig Start Date End Date Taking? Authorizing Provider  predniSONE (DELTASONE) 20 MG tablet Take 2 tablets (40 mg total) by mouth daily with breakfast for 5 days. 01/16/23 01/21/23 Yes Nayana Lenig, Janace Aris, MD  acetaminophen (TYLENOL) 325 MG tablet Take 650 mg by mouth every 6 (six) hours as needed for mild pain.    [provider]  albuterol (VENTOLIN HFA) 108 (90 Base) MCG/ACT inhaler Inhale 2 puffs into the lungs every 4 (four) hours as needed for wheezing or shortness of breath. 05/15/19 05/19/22  Jeannie Fend, PA-C  ferrous sulfate 325 (65 FE) MG tablet Take 325 mg by mouth daily with breakfast.    [provider]  folic acid (FOLVITE) 1 MG tablet Take 1 mg by mouth daily.    [provider]  hydroxyurea (HYDREA) 500 MG capsule Take 500 mg by mouth daily. May take with food to minimize GI side effects.    [provider]  methocarbamol (ROBAXIN) 500 MG tablet Take 1 tablet (500 mg total) by mouth at bedtime as needed for muscle spasms. 01/10/23   Lamptey, Britta Mccreedy, MD    Family History History reviewed. No pertinent family history.  Social History Social History   Tobacco Use   Smoking status: Former   Smokeless tobacco: Never   Substance Use Topics   Alcohol use: Not Currently    Alcohol/week: 12.0 standard drinks of alcohol    Types: 12 Cans of beer per week    Comment: in last 2 wks due to pancreatitis   Drug use: Yes    Types: Marijuana     Allergies   Beef-derived products, Egg-derived products, Iodine, Milk-related compounds, and Peanut-containing drug products   Review of Systems Review of Systems   Physical Exam Triage Vital Signs ED Triage Vitals  Enc Vitals Group     BP 01/16/23 1115 (!) 142/91     Pulse Rate 01/16/23 1115 75     Resp 01/16/23 1115 18     Temp 01/16/23 1115 98.7 F (37.1 C)     Temp Source 01/16/23 1115 Oral     SpO2 01/16/23 1115 (!) 20 %     Weight --      Height --      Head Circumference --      Peak Flow --      Pain Score 01/16/23 1116 5     Pain Loc --      Pain Edu? --      Excl. in GC? --    No data found.  Updated Vital Signs BP Marland Kitchen)  142/91   Pulse 75   Temp 98.7 F (37.1 C) (Oral)   Resp 18   SpO2 (!) 20%   Visual Acuity Right Eye Distance:   Left Eye Distance:   Bilateral Distance:    Right Eye Near:   Left Eye Near:    Bilateral Near:     Physical Exam Vitals reviewed.  Constitutional:      General: He is not in acute distress.    Appearance: He is not ill-appearing, toxic-appearing or diaphoretic.  HENT:     Mouth/Throat:     Mouth: Mucous membranes are moist.  Eyes:     Extraocular Movements: Extraocular movements intact.     Conjunctiva/sclera: Conjunctivae normal.     Pupils: Pupils are equal, round, and reactive to light.  Cardiovascular:     Rate and Rhythm: Normal rate and regular rhythm.     Heart sounds: No murmur heard. Pulmonary:     Effort: Pulmonary effort is normal. No respiratory distress.     Breath sounds: Normal breath sounds. No stridor. No wheezing, rhonchi or rales.  Musculoskeletal:     Cervical back: Neck supple.  Lymphadenopathy:     Cervical: No cervical adenopathy.  Skin:    Coloration: Skin is  not pale.     Comments: There is an extensive pink maculopapular rash on his neck bilaterally in upper and lower back and chest.  Neurological:     Mental Status: He is alert and oriented to person, place, and time.  Psychiatric:        Behavior: Behavior normal.      UC Treatments / Results  Labs (all labs ordered are listed, but only abnormal results are displayed) Labs Reviewed - No data to display  EKG   Radiology No results found.  Procedures Procedures (including critical care time)  Medications Ordered in UC Medications - No data to display  Initial Impression / Assessment and Plan / UC Course  I have reviewed the triage vital signs and the nursing notes.  Pertinent labs & imaging results that were available during my care of the patient were reviewed by me and considered in my medical decision making (see chart for details).        Burst of prednisone is sent in for contact dermatitis.  I recommended Benadryl or Zyrtec as needed.  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 Provider   predniSONE (DELTASONE) 20 MG tablet Take 2 tablets (40 mg total) by mouth daily with breakfast for 5 days. 10 tablet Marlinda Mike Janace Aris, MD      I have reviewed the PDMP during this encounter.   Zenia Resides, MD 01/16/23 1125

## 2023-02-05 ENCOUNTER — Ambulatory Visit
Admission: EM | Admit: 2023-02-05 | Discharge: 2023-02-05 | Disposition: A | Discharge status: Home / Self Care | Payer: Medicaid Other | Attending: Internal Medicine | Admitting: Internal Medicine

## 2023-02-05 DIAGNOSIS — R369 Urethral discharge, unspecified: Secondary | ICD-10-CM | POA: Insufficient documentation

## 2023-02-05 DIAGNOSIS — Z113 Encounter for screening for infections with a predominantly sexual mode of transmission: Secondary | ICD-10-CM | POA: Insufficient documentation

## 2023-02-05 NOTE — ED Triage Notes (Signed)
Pt c/o penile discharge (white) and scrotal discomfort that began this morning.  Home interventions: none

## 2023-02-05 NOTE — ED Provider Notes (Addendum)
[name]    CSN: [id number] Arrival date & time: 02/05/23  9629      History   Chief Complaint Chief Complaint  Patient presents with   Testicle Pain   Penile Discharge    HPI Dustin Franklin is a 42 y.o. male.   Patient presents today for STD testing due to a white penile discharge that started yesterday.  Patient reports that he also has had some "discomfort" in between his scrotum and rectum since penile discharge started.  Denies testicular pain or swelling, dysuria, urinary frequency.  Reports that he had unprotected intercourse with a male sexual partner but denies exposure to STD.   Testicle Pain  Penile Discharge    Past Medical History:  Diagnosis Date   Bronchitis    Hx of bone transplant    for sickle cell   Pancreatitis    Sickle cell anemia (HCC)     There are no problems to display for this patient.   History reviewed. No pertinent surgical history.     Home Medications    Prior to Admission medications   Medication Sig Start Date End Date Taking? Authorizing Provider  acetaminophen (TYLENOL) 325 MG tablet Take 650 mg by mouth every 6 (six) hours as needed for mild pain.    [provider]  albuterol (VENTOLIN HFA) 108 (90 Base) MCG/ACT inhaler Inhale 2 puffs into the lungs every 4 (four) hours as needed for wheezing or shortness of breath. 05/15/19 05/19/22  Jeannie Fend, PA-C  ferrous sulfate 325 (65 FE) MG tablet Take 325 mg by mouth daily with breakfast.    [provider]  folic acid (FOLVITE) 1 MG tablet Take 1 mg by mouth daily.    [provider]  hydroxyurea (HYDREA) 500 MG capsule Take 500 mg by mouth daily. May take with food to minimize GI side effects.    [provider]  methocarbamol (ROBAXIN) 500 MG tablet Take 1 tablet (500 mg total) by mouth at bedtime as needed for muscle spasms. 01/10/23   Lamptey, Britta Mccreedy, MD    Family History History reviewed. No pertinent family  history.  Social History Social History   Tobacco Use   Smoking status: Former   Smokeless tobacco: Never  Substance Use Topics   Alcohol use: Not Currently    Alcohol/week: 12.0 standard drinks of alcohol    Types: 12 Cans of beer per week    Comment: in last 2 wks due to pancreatitis   Drug use: Yes    Types: Marijuana     Allergies   Beef-derived products, Egg-derived products, Iodine, Milk-related compounds, and Peanut-containing drug products   Review of Systems Review of Systems Per HPI  Physical Exam Triage Vital Signs ED Triage Vitals  Enc Vitals Group     BP 02/05/23 1021 129/77     Pulse Rate 02/05/23 1021 60     Resp 02/05/23 1021 18     Temp 02/05/23 1021 97.9 F (36.6 C)     Temp Source 02/05/23 1021 Oral     SpO2 02/05/23 1021 95 %     Weight --      Height --      Head Circumference --      Peak Flow --      Pain Score 02/05/23 1020 5     Pain Loc --      Pain Edu? --      Excl. in GC? --    No  data found.  Updated Vital Signs BP 129/77 (BP Location: Left Arm)   Pulse 60   Temp 97.9 F (36.6 C) (Oral)   Resp 18   SpO2 95%   Visual Acuity Right Eye Distance:   Left Eye Distance:   Bilateral Distance:    Right Eye Near:   Left Eye Near:    Bilateral Near:     Physical Exam Constitutional:      General: He is not in acute distress.    Appearance: Normal appearance. He is not toxic-appearing or diaphoretic.  HENT:     Head: Normocephalic and atraumatic.  Eyes:     Extraocular Movements: Extraocular movements intact.     Conjunctiva/sclera: Conjunctivae normal.  Pulmonary:     Effort: Pulmonary effort is normal.  Genitourinary:    Comments: Deferred with shared decision making. Self swab performed.  Neurological:     General: No focal deficit present.     Mental Status: He is alert and oriented to person, place, and time. Mental status is at baseline.  Psychiatric:        Mood and Affect: Mood normal.        Behavior:  Behavior normal.        Thought Content: Thought content normal.        Judgment: Judgment normal.      UC Treatments / Results  Labs (all labs ordered are listed, but only abnormal results are displayed) Labs Reviewed  RPR  HIV ANTIBODY (ROUTINE TESTING W REFLEX)  CYTOLOGY, (ORAL, ANAL, URETHRAL) ANCILLARY ONLY    EKG   Radiology No results found.  Procedures Procedures (including critical care time)  Medications Ordered in UC Medications - No data to display  Initial Impression / Assessment and Plan / UC Course  I have reviewed the triage vital signs and the nursing notes.  Pertinent labs & imaging results that were available during my care of the patient were reviewed by me and considered in my medical decision making (see chart for details).     Cytology swab, HIV, RPR pending.  Given no confirmed exposure to STD, will await results for any further treatment.  Patient is denying any testicular pain or swelling so do not think that ultrasound or emergent evaluation is necessary.  Advised to refrain from sexual activity until test results and treatment are complete.  Discussed return precautions.  Patient verbalized understanding and was agreeable with plan. Final Clinical Impressions(s) / UC Diagnoses   Final diagnoses:  Screening examination for venereal disease  Penile discharge     Discharge Instructions      Your STD testing is in pending.  We will call if it is abnormal and treat.  Follow-up if any symptoms persist or worsen.    ED Prescriptions   None    PDMP not reviewed this encounter.   Gustavus Bryant, [place] 02/05/23 1039    [address] [place] 02/05/23 1040

## 2023-02-05 NOTE — Discharge Instructions (Signed)
Your STD testing is in pending.  We will call if it is abnormal and treat.  Follow-up if any symptoms persist or worsen.

## 2023-02-07 LAB — CYTOLOGY, (ORAL, ANAL, URETHRAL) ANCILLARY ONLY
Chlamydia: NEGATIVE
Comment: NEGATIVE
Comment: NEGATIVE
Comment: NORMAL
Neisseria Gonorrhea: NEGATIVE
Trichomonas: NEGATIVE

## 2023-02-07 LAB — HIV ANTIBODY (ROUTINE TESTING W REFLEX): HIV Screen 4th Generation wRfx: NONREACTIVE

## 2023-02-07 LAB — RPR: RPR Ser Ql: NONREACTIVE

## 2023-06-29 ENCOUNTER — Ambulatory Visit (HOSPITAL_COMMUNITY)
Admission: EM | Admit: 2023-06-29 | Discharge: 2023-06-29 | Disposition: A | Discharge status: Home / Self Care | Payer: Medicaid Other | Attending: Family Medicine | Admitting: Family Medicine

## 2023-06-29 ENCOUNTER — Encounter (HOSPITAL_COMMUNITY): Payer: Self-pay

## 2023-06-29 DIAGNOSIS — Z202 Contact with and (suspected) exposure to infections with a predominantly sexual mode of transmission: Secondary | ICD-10-CM | POA: Diagnosis not present

## 2023-06-29 LAB — HIV ANTIBODY (ROUTINE TESTING W REFLEX): HIV Screen 4th Generation wRfx: NONREACTIVE

## 2023-06-29 LAB — RPR: RPR Ser Ql: NONREACTIVE

## 2023-06-29 MED ORDER — METRONIDAZOLE 500 MG PO TABS
ORAL_TABLET | ORAL | Status: AC
  Filled 2023-06-29: qty 4

## 2023-06-29 MED ORDER — METRONIDAZOLE 500 MG PO TABS
2000.0000 mg | ORAL_TABLET | Freq: Once | ORAL | Status: AC
  Administered 2023-06-29: 2000 mg via ORAL

## 2023-06-29 NOTE — Discharge Instructions (Signed)
You have been given the following today for treatment of possible trichomonas:  Meds ordered this encounter  Medications   metroNIDAZOLE (FLAGYL) tablet 2,000 mg     Even though we have treated you today, we have sent testing for sexually transmitted infections. We will notify you of any positive results once they are received. If required, we will prescribe any medications you might need.  Please refrain from all sexual activity for at least the next seven days.

## 2023-06-29 NOTE — ED Provider Notes (Signed)
[facility]   [id number] 06/29/23 Arrival Time: 0272  ASSESSMENT & PLAN:  1. STD exposure    Empiric tx for trich given.    Discharge Instructions      You have been given the following today for treatment of possible trichomonas:  Meds ordered this encounter  Medications   metroNIDAZOLE (FLAGYL) tablet 2,000 mg   Even though we have treated you today, we have sent testing for sexually transmitted infections. We will notify you of any positive results once they are received. If required, we will prescribe any medications you might need.  Please refrain from all sexual activity for at least the next seven days.     Pending: Labs Reviewed  HIV ANTIBODY (ROUTINE TESTING W REFLEX)  RPR  CYTOLOGY, (ORAL, ANAL, URETHRAL) ANCILLARY ONLY    Will notify of any positive results. Instructed to refrain from sexual activity for at least seven days.  Reviewed expectations re: course of current medical issues. Questions answered. Outlined signs and symptoms indicating need for more acute intervention. Patient verbalized understanding. After Visit Summary given.   SUBJECTIVE:  Dustin Franklin is a 42 y.o. male who reports possible trich exposure. No symptoms. Desires testing and treatment.   OBJECTIVE:  Vitals:   06/29/23 1019  BP: 135/79  Pulse: 66  Resp: 16  Temp: 98.7 F (37.1 C)  TempSrc: Oral  SpO2: 94%  Height: 5\' 7"  (1.702 m)     General appearance: alert, cooperative, appears stated age and no distress GU: normal appearing genitalia Skin: warm and dry Psychological: alert and cooperative; normal mood and affect.    Labs Reviewed  HIV ANTIBODY (ROUTINE TESTING W REFLEX)  RPR  CYTOLOGY, (ORAL, ANAL, URETHRAL) ANCILLARY ONLY    Allergies  Allergen Reactions   Beef-Derived Products     Hives, breakouts.    Egg-Derived Products Other (See Comments)    Reaction unknown   Iodine Other (See Comments)    Topical iodine (not CT contrast);  Reaction unknown   Milk-Related Compounds     Hives, vomiting.    Peanut-Containing Drug Products Other (See Comments)    Reaction unknown    Past Medical History:  Diagnosis Date   Bronchitis    Hx of bone transplant    for sickle cell   Pancreatitis    Sickle cell anemia (HCC)    History reviewed. No pertinent family history. Social History   Socioeconomic History   Marital status: Married    Spouse name: Not on file   Number of children: Not on file   Years of education: Not on file   Highest education level: Not on file  Occupational History   Not on file  Tobacco Use   Smoking status: Former   Smokeless tobacco: Never  Substance and Sexual Activity   Alcohol use: Not Currently    Alcohol/week: 12.0 standard drinks of alcohol    Types: 12 Cans of beer per week    Comment: in last 2 wks due to pancreatitis   Drug use: Yes    Types: Marijuana   Sexual activity: Not on file  Other Topics Concern   Not on file  Social History Narrative   Not on file   Social Determinants of Health   Financial Resource Strain: Not on file  Food Insecurity: Not on file  Transportation Needs: Not on file  Physical Activity: Not on file  Stress: Not on file  Social Connections: Not on file  Intimate Partner Violence: Not on  file           Mardella Layman, MD 06/29/23 1054

## 2023-06-29 NOTE — ED Triage Notes (Signed)
Patient here today after being told that a male he has been sleeping with tested positive with Trich. Patient is not having any symptoms.

## 2023-06-30 LAB — CYTOLOGY, (ORAL, ANAL, URETHRAL) ANCILLARY ONLY
Chlamydia: NEGATIVE
Comment: NEGATIVE
Comment: NEGATIVE
Comment: NORMAL
Neisseria Gonorrhea: NEGATIVE
Trichomonas: NEGATIVE

## 2023-10-13 IMAGING — CT CT MAXILLOFACIAL W/O CM
3 of 4 series · 15 of 47 positions shown, 18 images · non-contrast
Comparison: None.

CLINICAL DATA: Basketball injury, nasal deformity, bleeding



[Series 9: coronal soft tissue · coronal · 0.30mm/px · 3 of 78 slices shown]
[im 26/78  bone]
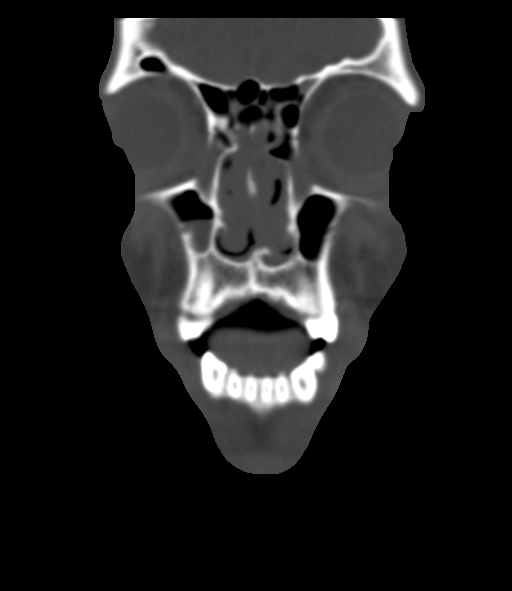
[im 35/78  bone]
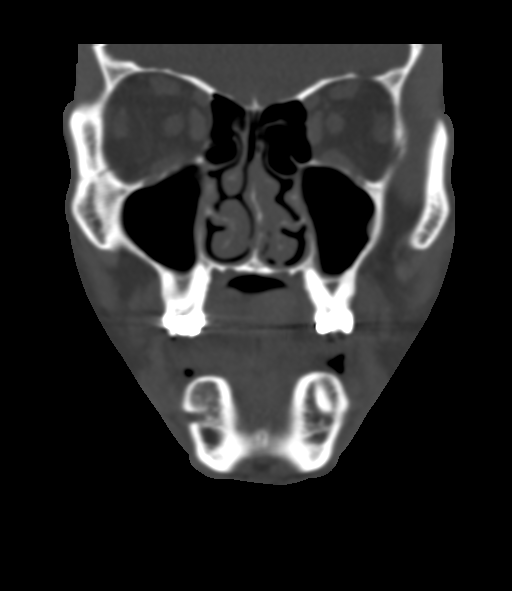
[im 43/78  bone]
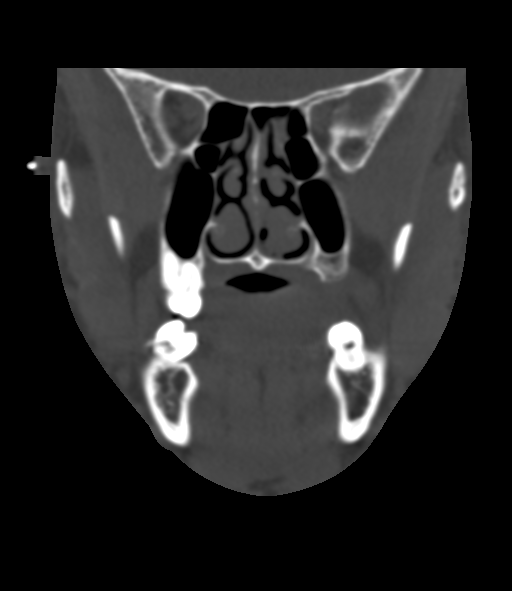

[Series 11: sagittal soft tissue · sagittal · 0.29mm/px · 3 of 76 slices shown]
[im 26/76  bone]
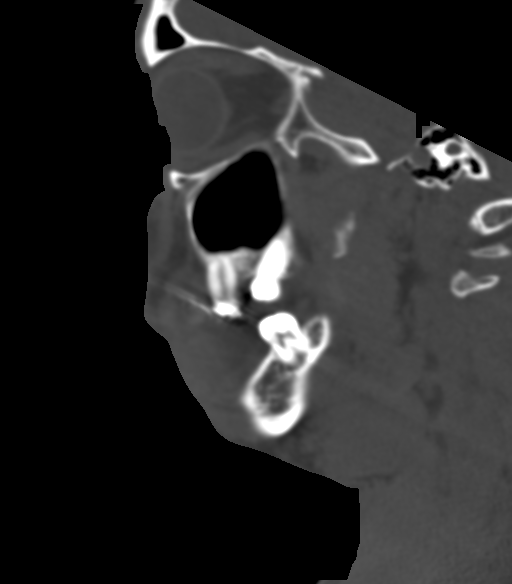
[im 38/76  bone]
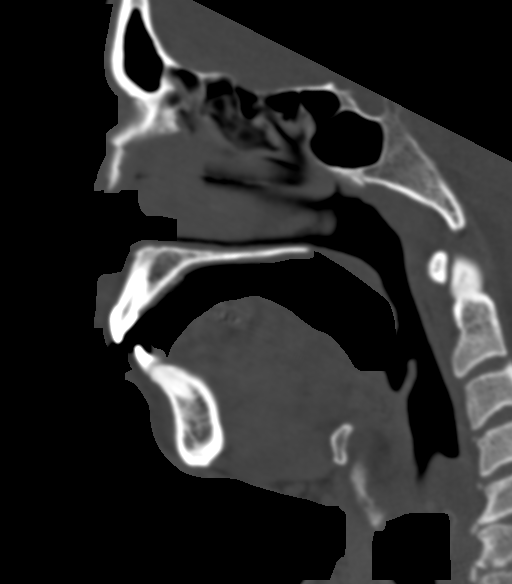
[im 51/76  bone]
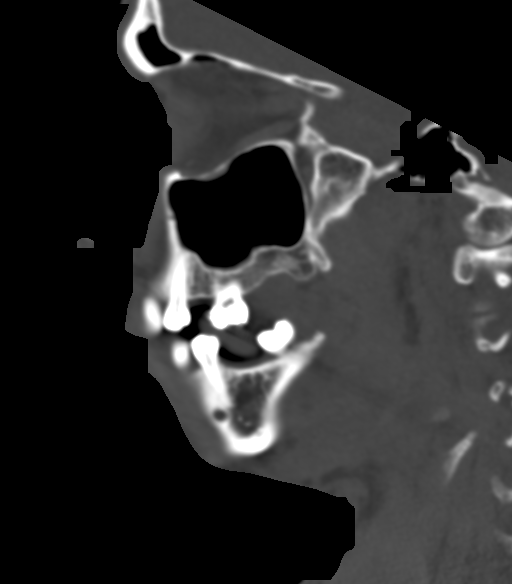

[Series 13: facial/ orbits 2.0 mpr ax · axial · 0.28mm/px · z∈[-295,-160]mm · 9 of 84 slices shown, 12 images]
[im 6/84  brain]
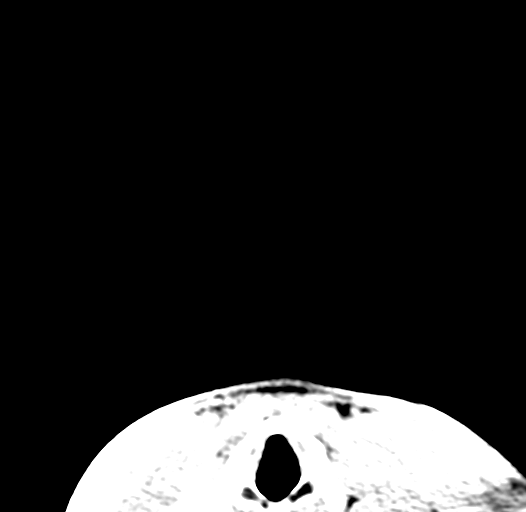
[im 6/84  bone]
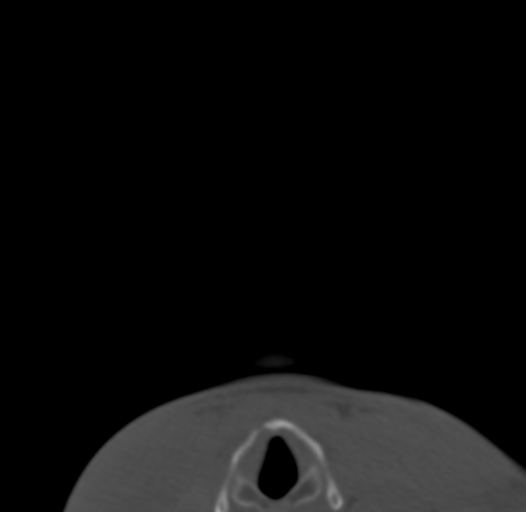
[im 17/84  bone]
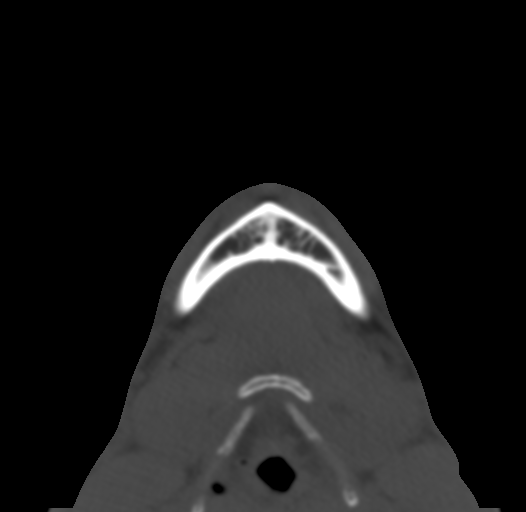
[im 23/84  bone]
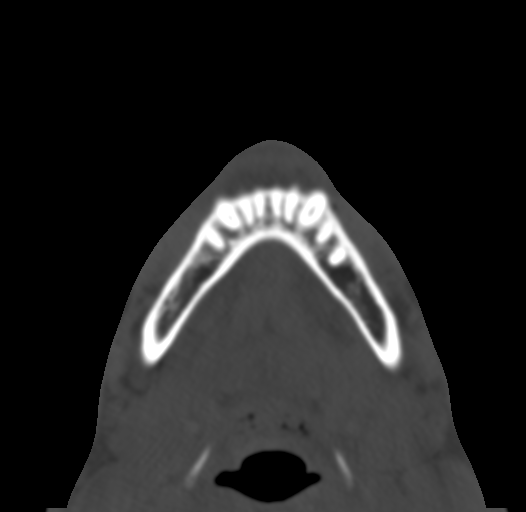
[im 34/84  bone]
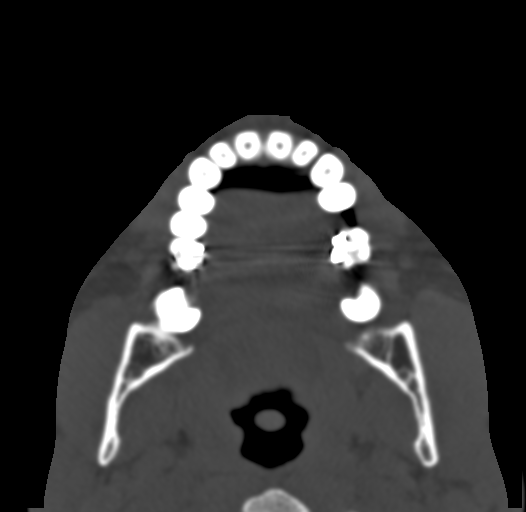
[im 45/84  brain]
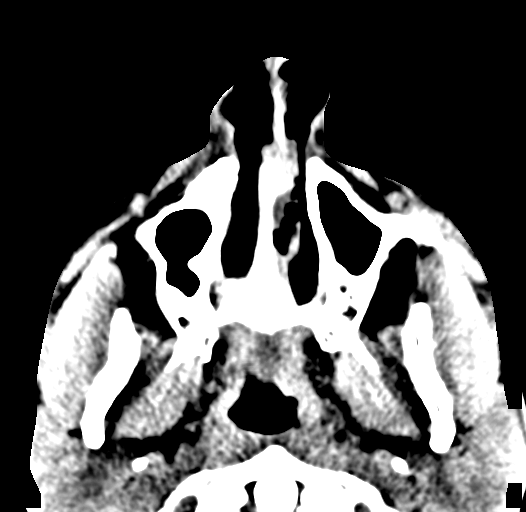
[im 45/84  bone]
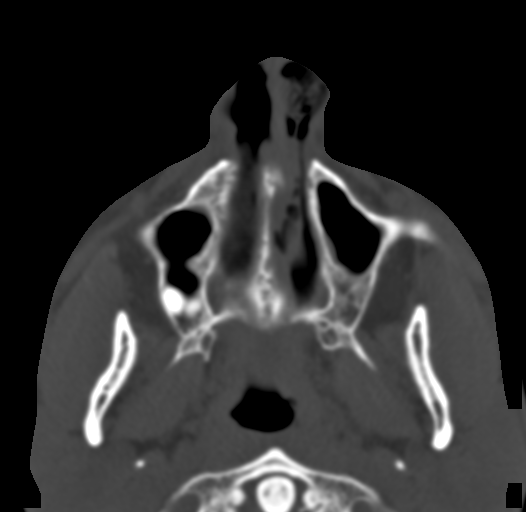
[im 50/84  bone]
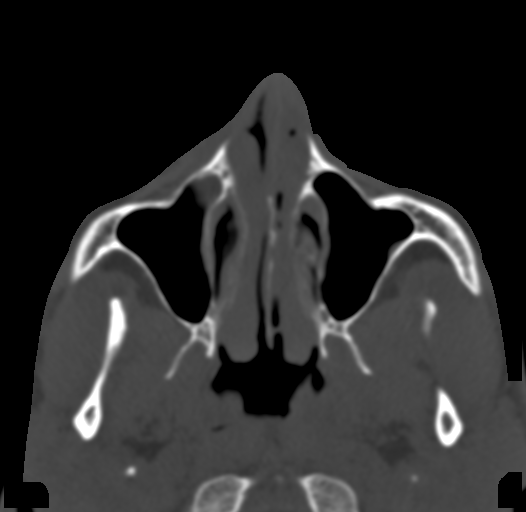
[im 61/84  bone]
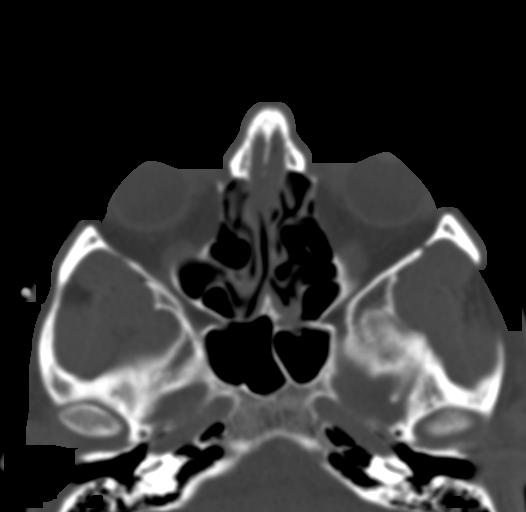
[im 67/84  bone]
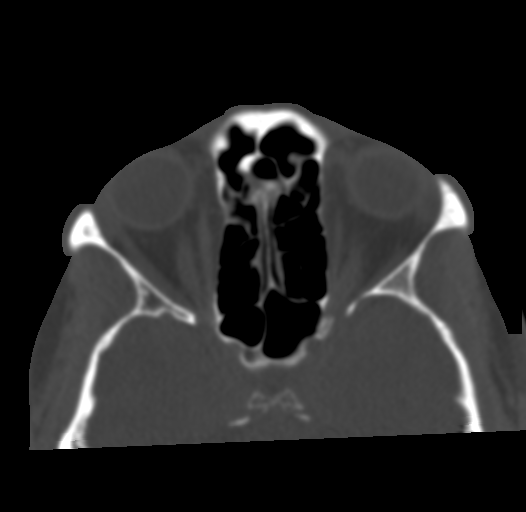
[im 78/84  brain]
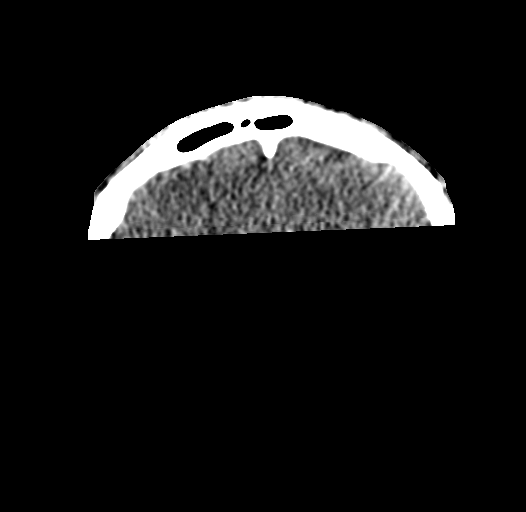
[im 78/84  bone]
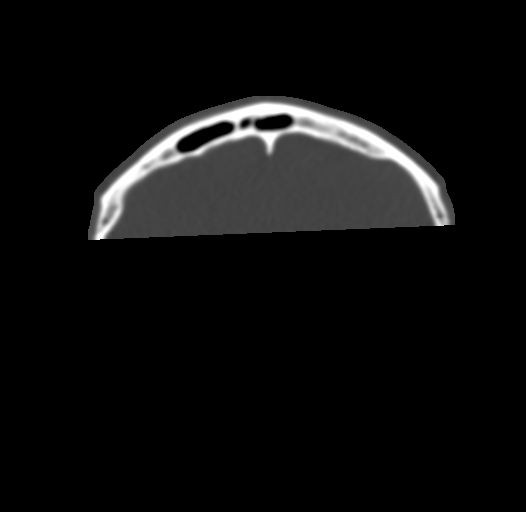

[15 of 47 positions shown; findings below may reference images not displayed]

FINDINGS: Osseous: There is a comminuted displaced nasal bone fracture
extending bilaterally along the nasal bridge. Mild depression along
the right aspect of the nasal bone fracture. Nasal septum is
deviated to the left.

No other acute displaced facial bone fractures. Temporomandibular
joints are well aligned.

Orbits: The bilateral globes and orbits are unremarkable. No acute
trauma.

Sinuses: Minimal polypoid mucosal thickening within the maxillary
sinuses, right greater than left.

Soft tissues: Prominent soft tissue swelling over the nasal bridge,
right greater than left. Remaining soft tissues are unremarkable.

Limited intracranial: No significant or unexpected finding.
IMPRESSION: 1. Comminuted displaced nasal bone fracture with overlying soft
tissue swelling.

## 2023-11-04 ENCOUNTER — Ambulatory Visit
Admission: EM | Admit: 2023-11-04 | Discharge: 2023-11-04 | Disposition: A | Discharge status: Home / Self Care | Attending: Family Medicine | Admitting: Family Medicine

## 2023-11-04 DIAGNOSIS — Z202 Contact with and (suspected) exposure to infections with a predominantly sexual mode of transmission: Secondary | ICD-10-CM | POA: Insufficient documentation

## 2023-11-04 DIAGNOSIS — N342 Other urethritis: Secondary | ICD-10-CM | POA: Diagnosis not present

## 2023-11-04 DIAGNOSIS — M5412 Radiculopathy, cervical region: Secondary | ICD-10-CM | POA: Insufficient documentation

## 2023-11-04 DIAGNOSIS — M75101 Unspecified rotator cuff tear or rupture of right shoulder, not specified as traumatic: Secondary | ICD-10-CM | POA: Insufficient documentation

## 2023-11-04 MED ORDER — CEFTRIAXONE SODIUM 500 MG IJ SOLR
500.0000 mg | INTRAMUSCULAR | Status: DC
  Administered 2023-11-04: 500 mg via INTRAMUSCULAR

## 2023-11-04 MED ORDER — DOXYCYCLINE HYCLATE 100 MG PO CAPS: 100.0000 mg | ORAL_CAPSULE | Freq: Two times a day (BID) | ORAL | 0 refills | Status: AC

## 2023-11-04 NOTE — ED Triage Notes (Signed)
"  A girl that I am out their with has given me a few things and I am now having green penis discharge".

## 2023-11-04 NOTE — Discharge Instructions (Addendum)
 Staff will notify you if there is anything positive on the swab or blood work.  You have been given a shot of ceftriaxone 500 mg  Take doxycycline 100 mg --1 capsule 2 times daily for 7 days

## 2023-11-04 NOTE — ED Provider Notes (Signed)
 [name]    CSN: [id number] Arrival date & time: 11/04/23  1845      History   Chief Complaint Chief Complaint  Patient presents with   SEXUALLY TRANSMITTED DISEASE    Testing.    HPI Dustin Franklin is a 43 y.o. male.   HPI Here for dysuria and penile discharge.  It began 2 days ago.  No fever or vomiting or abdominal pain.  He is allergic to iodine   Past Medical History:  Diagnosis Date   Bronchitis    Hx of bone transplant    for sickle cell   Pancreatitis    Sickle cell anemia [facility])     Patient Active Problem List   Diagnosis Date Noted   Rotator cuff syndrome of right shoulder 11/04/2023   Cervical radiculopathy 11/04/2023   Vitamin D deficiency 09/21/2022   Pancreatitis 09/21/2022   Sickle cell disease, type SS (HCC) 09/21/2022    History reviewed. No pertinent surgical history.     Home Medications    Prior to Admission medications   Medication Sig Start Date End Date Taking? Authorizing Provider  acetaminophen-codeine (TYLENOL #3) 300-30 MG tablet Take 1-2 tablets by mouth every 6 (six) hours as needed. 07/05/23  Yes [provider]  amoxicillin (AMOXIL) 500 MG tablet Take 500 mg by mouth 2 (two) times daily. 07/05/23  Yes [provider]  chlorhexidine (PERIDEX) 0.12 % solution Use as directed 5 mLs in the mouth or throat 2 (two) times daily. 07/05/23  Yes [provider]  doxycycline (VIBRAMYCIN) 100 MG capsule Take 1 capsule (100 mg total) by mouth 2 (two) times daily for 7 days. 11/04/23 11/11/23 Yes Zenia Resides, MD  ferrous sulfate 325 (65 FE) MG tablet Take 325 mg by mouth daily with breakfast.   Yes [provider]  folic acid (FOLVITE) 1 MG tablet Take 1 mg by mouth daily.   Yes [provider]  hydroxyurea (HYDREA) 500 MG capsule Take 500 mg by mouth daily. May take with food to minimize GI side effects.   Yes [provider]  acetaminophen (TYLENOL) 325 MG tablet  Take 650 mg by mouth every 6 (six) hours as needed for mild pain.    [provider]  albuterol (VENTOLIN HFA) 108 (90 Base) MCG/ACT inhaler Inhale 2 puffs into the lungs every 4 (four) hours as needed for wheezing or shortness of breath. 05/15/19 05/19/22  Jeannie Fend, PA-C  amoxicillin-clavulanate (AUGMENTIN) 875-125 MG tablet Take 1 tablet by mouth every 12 (twelve) hours.    [provider]  aspirin-acetaminophen-caffeine (EXCEDRIN EXTRA STRENGTH) [phone] MG tablet Take 1 tablet by mouth every 6 (six) hours as needed.    [provider]  gabapentin (NEURONTIN) 300 MG capsule Take 300 mg by mouth at bedtime.    [provider]  meloxicam (MOBIC) 15 MG tablet Take 15 mg by mouth daily.    [provider]  Methocarbamol (ROBAXIN PO) Take 500 mg by mouth at bedtime as needed.    [provider]  methocarbamol (ROBAXIN) 500 MG tablet Take 1 tablet (500 mg total) by mouth at bedtime as needed for muscle spasms. 01/10/23   LampteyBritta Mccreedy, MD  metroNIDAZOLE (FLAGYL) 500 MG tablet Take 2,000 mg by mouth as directed.    [provider]    Family History History reviewed. No pertinent family history.  Social History Social History   Tobacco Use   Smoking status: Former    Types:  Cigarettes   Smokeless tobacco: Never  Vaping Use   Vaping status: Never Used  Substance Use Topics   Alcohol use: Not Currently    Alcohol/week: 12.0 standard drinks of alcohol    Types: 12 Cans of beer per week    Comment: in last 2 wks due to pancreatitis   Drug use: Yes    Types: Marijuana     Allergies   Beef-derived drug products, Egg-derived products, Influenza virus vaccine, Iodine, Milk-related compounds, and Peanut-containing drug products   Review of Systems Review of Systems   Physical Exam Triage Vital Signs ED Triage Vitals  Encounter Vitals Group     BP 11/04/23 1855 138/74     Systolic BP Percentile --      Diastolic  BP Percentile --      Pulse Rate 11/04/23 1855 79     Resp 11/04/23 1855 16     Temp 11/04/23 1855 98.7 F (37.1 C)     Temp Source 11/04/23 1855 Oral     SpO2 11/04/23 1855 96 %     Weight 11/04/23 1853 160 lb (72.6 kg)     Height 11/04/23 1853 5\' 7"  (1.702 m)     Head Circumference --      Peak Flow --      Pain Score 11/04/23 1851 0     Pain Loc --      Pain Education --      Exclude from Growth Chart --    No data found.  Updated Vital Signs BP 138/74 (BP Location: Left Arm)   Pulse 79   Temp 98.7 F (37.1 C) (Oral)   Resp 16   Ht 5\' 7"  (1.702 m)   Wt 72.6 kg   SpO2 96%   BMI 25.06 kg/m   Visual Acuity Right Eye Distance:   Left Eye Distance:   Bilateral Distance:    Right Eye Near:   Left Eye Near:    Bilateral Near:     Physical Exam Vitals reviewed.  Constitutional:      General: He is not in acute distress. Skin:    Coloration: Skin is not jaundiced or pale.  Neurological:     Mental Status: He is alert and oriented to person, place, and time.  Psychiatric:        Behavior: Behavior normal.      UC Treatments / Results  Labs (all labs ordered are listed, but only abnormal results are displayed) Labs Reviewed  HIV ANTIBODY (ROUTINE TESTING W REFLEX)  RPR  CYTOLOGY, (ORAL, ANAL, URETHRAL) ANCILLARY ONLY    EKG   Radiology No results found.  Procedures Procedures (including critical care time)  Medications Ordered in UC Medications  cefTRIAXone (ROCEPHIN) injection 500 mg (500 mg Intramuscular Given 11/04/23 2001)    Initial Impression / Assessment and Plan / UC Course  I have reviewed the triage vital signs and the nursing notes.  Pertinent labs & imaging results that were available during my care of the patient were reviewed by me and considered in my medical decision making (see chart for details).      Blood is drawn to check HIV and RPR.  Urethral self swab is done and staff will notify him of any positives and treat per  protocol.  Final Clinical Impressions(s) / UC Diagnoses   Final diagnoses:  Urethritis  Exposure to STD     Discharge Instructions      Staff will notify you if there is anything positive  on the swab or blood work.  You have been given a shot of ceftriaxone 500 mg  Take doxycycline 100 mg --1 capsule 2 times daily for 7 days     ED Prescriptions     Medication Sig Dispense Auth. Provider   doxycycline (VIBRAMYCIN) 100 MG capsule Take 1 capsule (100 mg total) by mouth 2 (two) times daily for 7 days. 14 capsule Marlinda Mike, Janace Aris, MD      PDMP not reviewed this encounter.   Zenia Resides, MD 11/04/23 2008

## 2023-11-06 LAB — RPR: RPR Ser Ql: NONREACTIVE

## 2023-11-06 LAB — HIV ANTIBODY (ROUTINE TESTING W REFLEX): HIV Screen 4th Generation wRfx: NONREACTIVE

## 2023-11-07 ENCOUNTER — Telehealth (HOSPITAL_COMMUNITY): Payer: Self-pay

## 2023-11-07 LAB — CYTOLOGY, (ORAL, ANAL, URETHRAL) ANCILLARY ONLY
Chlamydia: POSITIVE — AB
Comment: NEGATIVE
Comment: NEGATIVE
Comment: NORMAL
Neisseria Gonorrhea: POSITIVE — AB
Trichomonas: NEGATIVE

## 2023-11-07 NOTE — Telephone Encounter (Signed)
 Created in error

## 2024-03-09 ENCOUNTER — Ambulatory Visit: Admission: EM | Admit: 2024-03-09 | Discharge: 2024-03-09 | Disposition: A | Discharge status: Home / Self Care

## 2024-03-09 ENCOUNTER — Emergency Department (HOSPITAL_COMMUNITY)

## 2024-03-09 ENCOUNTER — Other Ambulatory Visit: Payer: Self-pay

## 2024-03-09 ENCOUNTER — Emergency Department (HOSPITAL_COMMUNITY)
Admission: EM | Admit: 2024-03-09 | Discharge: 2024-03-09 | Disposition: A | Discharge status: Home / Self Care | Source: Ambulatory Visit | Attending: Emergency Medicine | Admitting: Emergency Medicine

## 2024-03-09 DIAGNOSIS — Z7982 Long term (current) use of aspirin: Secondary | ICD-10-CM | POA: Diagnosis not present

## 2024-03-09 DIAGNOSIS — N433 Hydrocele, unspecified: Secondary | ICD-10-CM | POA: Diagnosis not present

## 2024-03-09 DIAGNOSIS — N50819 Testicular pain, unspecified: Secondary | ICD-10-CM | POA: Diagnosis not present

## 2024-03-09 DIAGNOSIS — N50811 Right testicular pain: Secondary | ICD-10-CM | POA: Insufficient documentation

## 2024-03-09 DIAGNOSIS — Z91010 Allergy to peanuts: Secondary | ICD-10-CM | POA: Insufficient documentation

## 2024-03-09 LAB — COMPREHENSIVE METABOLIC PANEL WITH GFR
ALT: 11 U/L (ref 0–44)
AST: 16 U/L (ref 15–41)
Albumin: 3.8 g/dL (ref 3.5–5.0)
Alkaline Phosphatase: 47 U/L (ref 38–126)
Anion gap: 8 (ref 5–15)
BUN: 15 mg/dL (ref 6–20)
CO2: 25 mmol/L (ref 22–32)
Calcium: 8.5 mg/dL — ABNORMAL LOW (ref 8.9–10.3)
Chloride: 105 mmol/L (ref 98–111)
Creatinine, Ser: 0.83 mg/dL (ref 0.61–1.24)
GFR, Estimated: >60 mL/min (ref >60)
Glucose, Bld: 85 mg/dL (ref 70–99)
Potassium: 3.9 mmol/L (ref 3.5–5.1)
Sodium: 138 mmol/L (ref 135–145)
Total Bilirubin: 0.6 mg/dL (ref 0.0–1.2)
Total Protein: 6.9 g/dL (ref 6.5–8.1)

## 2024-03-09 LAB — CBC WITH DIFFERENTIAL/PLATELET
Abs Immature Granulocytes: 0.03 K/uL (ref 0.00–0.07)
Basophils Absolute: 0 K/uL (ref 0.0–0.1)
Basophils Relative: 0 %
Eosinophils Absolute: 0.1 K/uL (ref 0.0–0.5)
Eosinophils Relative: 1 %
HCT: 40.3 % (ref 39.0–52.0)
Hemoglobin: 13 g/dL (ref 13.0–17.0)
Immature Granulocytes: 0 %
Lymphocytes Relative: 12 %
Lymphs Abs: 1.3 K/uL (ref 0.7–4.0)
MCH: 29.4 pg (ref 26.0–34.0)
MCHC: 32.3 g/dL (ref 30.0–36.0)
MCV: 91.2 fL (ref 80.0–100.0)
Monocytes Absolute: 0.7 K/uL (ref 0.1–1.0)
Monocytes Relative: 7 %
Neutro Abs: 8.1 K/uL — ABNORMAL HIGH (ref 1.7–7.7)
Neutrophils Relative %: 80 %
Platelets: 196 K/uL (ref 150–400)
RBC: 4.42 MIL/uL (ref 4.22–5.81)
RDW: 13.7 % (ref 11.5–15.5)
WBC: 10.3 K/uL (ref 4.0–10.5)
nRBC: 0 % (ref 0.0–0.2)

## 2024-03-09 LAB — RETICULOCYTES
Immature Retic Fract: 4.1 % (ref 2.3–15.9)
RBC.: 4.41 MIL/uL (ref 4.22–5.81)
Retic Count, Absolute: 42.3 K/uL (ref 19.0–186.0)
Retic Ct Pct: 1 % (ref 0.4–3.1)

## 2024-03-09 LAB — URINALYSIS, ROUTINE W REFLEX MICROSCOPIC
Bilirubin Urine: NEGATIVE
Glucose, UA: NEGATIVE mg/dL
Hgb urine dipstick: NEGATIVE
Ketones, ur: NEGATIVE mg/dL
Leukocytes,Ua: NEGATIVE
Nitrite: NEGATIVE
Protein, ur: NEGATIVE mg/dL
Specific Gravity, Urine: 1.016 (ref 1.005–1.030)
pH: 7 (ref 5.0–8.0)

## 2024-03-09 LAB — HIV ANTIBODY (ROUTINE TESTING W REFLEX): HIV Screen 4th Generation wRfx: NONREACTIVE

## 2024-03-09 MED ORDER — SODIUM CHLORIDE 0.45 % IV SOLN: INTRAVENOUS | Status: DC

## 2024-03-09 MED ORDER — KETOROLAC TROMETHAMINE 15 MG/ML IJ SOLN
15.0000 mg | Freq: Once | INTRAMUSCULAR | Status: AC
  Administered 2024-03-09: 15 mg via INTRAVENOUS
  Filled 2024-03-09: qty 1

## 2024-03-09 MED ORDER — HYDROMORPHONE HCL 1 MG/ML IJ SOLN
0.5000 mg | Freq: Once | INTRAMUSCULAR | Status: AC
  Administered 2024-03-09: 0.5 mg via INTRAVENOUS
  Filled 2024-03-09: qty 1

## 2024-03-09 NOTE — ED Provider Notes (Signed)
 Patient presents today for evaluation of right testicular pain and swelling.  He reports that he first noticed it yesterday but today is worse.  On a scale of 0-10 he rates his pain is a 25.  Recommended further evaluation in the emergency room as I suspect he will need ultrasound and further evaluation and management.  Patient is agreeable with same.   Billy Asberry FALCON, PA-C 03/09/24 1001

## 2024-03-09 NOTE — ED Triage Notes (Signed)
 Pt reports with right testicle pain. Pt reports that his girlfriend be doing too much with his testicles and started having pain after rough sex.

## 2024-03-09 NOTE — ED Provider Notes (Signed)
 [place] [facility]MENT AT [facility] Provider Note  CSN: [id number] Arrival date & time: 03/09/24 1031  Chief Complaint(s) Testicle Pain  HPI Dustin Franklin is a 43 y.o. male     Testicle Pain This is a new problem. The current episode started 6 to 12 hours ago. The problem occurs constantly. The problem has been rapidly worsening. Pertinent negatives include no chest pain, no abdominal pain, no headaches and no shortness of breath. Exacerbated by: palpation. Nothing relieves the symptoms. He has tried nothing for the symptoms.   Patient denies any penile discharge, dysuria or pelvic pain.  He is sexually active with 1 partner.  States that she likes to play too much with his testicles.  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 Authorizing Provider  acetaminophen  (TYLENOL ) 325 MG tablet Take 650 mg by mouth every 6 (six) hours as needed for mild pain.    [provider]  acetaminophen -codeine  (TYLENOL  #3) 300-30 MG tablet Take 1-2 tablets by mouth every 6 (six) hours as needed. 07/05/23   [provider]  albuterol  (VENTOLIN  HFA) 108 (90 Base) MCG/ACT inhaler Inhale 2 puffs into the lungs every 4 (four) hours as needed for wheezing or shortness of breath. 05/15/19 05/19/22  Beverley Leita LABOR, PA-C  amoxicillin  (AMOXIL ) 500 MG tablet Take 500 mg by mouth 2 (two) times daily. 07/05/23   [provider]  amoxicillin -clavulanate (AUGMENTIN ) 875-125 MG tablet Take 1 tablet by  mouth every 12 (twelve) hours.    [provider]  aspirin-acetaminophen -caffeine (EXCEDRIN EXTRA STRENGTH) 250-250-65 MG tablet Take 1 tablet by mouth every 6 (six) hours as needed.    [provider]  chlorhexidine (PERIDEX) 0.12 % solution Use as directed 5 mLs in the mouth or throat 2 (two) times daily. 07/05/23   [provider]  ferrous sulfate 325 (65 FE) MG tablet Take 325 mg by mouth daily with breakfast.    [provider]  folic acid (FOLVITE) 1 MG tablet Take 1 mg by mouth daily.    [provider]  gabapentin (NEURONTIN) 300 MG capsule Take 300 mg by mouth at bedtime.    [provider]  gabapentin (NEURONTIN) 300 MG capsule Take 300 mg by mouth as directed.    [provider]  hydroxyurea (HYDREA) 500 MG capsule Take 500 mg by mouth daily. May take with food to minimize GI side effects.    [provider]  meloxicam (MOBIC) 15 MG tablet Take 15 mg by mouth daily.    [provider]  meloxicam (MOBIC) 15 MG tablet Take 15 mg by mouth daily.    [provider]  Methocarbamol  (ROBAXIN  PO) Take 500 mg by mouth at bedtime as needed.    [provider]  methocarbamol  (ROBAXIN ) 500 MG tablet Take 1 tablet (500 mg total) by mouth at bedtime as needed for muscle spasms. 01/10/23   Blaise Aleene KIDD, MD  metroNIDAZOLE  (FLAGYL ) 500 MG tablet Take 2,000  mg by mouth as directed.    [provider]                                                                                                                                    Allergies Beef-derived drug products, Egg-derived products, Influenza virus vaccine, Iodine, Milk-related compounds, and Peanut-containing drug products  Review of Systems Review of Systems  Respiratory:  Negative for shortness of breath.   Cardiovascular:  Negative for chest pain.  Gastrointestinal:  Negative for abdominal pain.  Genitourinary:  Positive for  testicular pain.  Neurological:  Negative for headaches.   As noted in HPI  Physical Exam Vital Signs  I have reviewed the triage vital signs BP (!) 140/80   Pulse 63   Temp 98.1 F (36.7 C) (Oral)   Resp 20   SpO2 97%   Physical Exam Vitals reviewed.  Constitutional:      General: He is not in acute distress.    Appearance: He is well-developed. He is not diaphoretic.  HENT:     Head: Normocephalic and atraumatic.     Right Ear: External ear normal.     Left Ear: External ear normal.     Nose: Nose normal.     Mouth/Throat:     Mouth: Mucous membranes are moist.  Eyes:     General: No scleral icterus.    Conjunctiva/sclera: Conjunctivae normal.  Neck:     Trachea: Phonation normal.  Cardiovascular:     Rate and Rhythm: Normal rate and regular rhythm.  Pulmonary:     Effort: Pulmonary effort is normal. No respiratory distress.     Breath sounds: No stridor.  Abdominal:     General: There is no distension.  Genitourinary:    Penis: Circumcised. No erythema, discharge or lesions.      Epididymis:     Right: Tenderness present.     Left: No tenderness.  Musculoskeletal:        General: Normal range of motion.     Cervical back: Normal range of motion.  Neurological:     Mental Status: He is alert and oriented to person, place, and time.  Psychiatric:        Behavior: Behavior normal.     ED Results and Treatments Labs (all labs ordered are listed, but only abnormal results are displayed) Labs Reviewed  CBC WITH DIFFERENTIAL/PLATELET - Abnormal; Notable for the following components:      Result Value   Neutro Abs 8.1 (*)    All other components within normal limits  COMPREHENSIVE METABOLIC PANEL WITH GFR - Abnormal; Notable for the following components:   Calcium 8.5 (*)    All other components within normal limits  URINE CULTURE  RETICULOCYTES  URINALYSIS, ROUTINE W REFLEX MICROSCOPIC  HIV ANTIBODY (ROUTINE TESTING W REFLEX)  GC/CHLAMYDIA PROBE AMP  ([place]) NOT AT [facility]  EKG  EKG Interpretation Date/Time:    Ventricular Rate:    PR Interval:    QRS Duration:    QT Interval:    QTC Calculation:   R Axis:      Text Interpretation:         Radiology US  SCROTUM W/DOPPLER Result Date: 03/09/2024 CLINICAL DATA:  testicle pain. EXAM: SCROTAL ULTRASOUND DOPPLER ULTRASOUND OF THE TESTICLES TECHNIQUE: Complete ultrasound examination of the testicles, epididymis, and other scrotal structures was performed. Color and spectral Doppler ultrasound were also utilized to evaluate blood flow to the testicles. COMPARISON:  None Available. FINDINGS: Right testicle Measurements: 2.5 x 3.3 x 4.3 cm. No mass or microlithiasis visualized. Left testicle Measurements: 2.5 x 2.6 x 4.7 cm. No mass or microlithiasis visualized. Right epididymis:  Normal in size and appearance. Left epididymis:  Normal in size and appearance. Hydrocele: Small amount of fluid in the right scrotal sac, likely physiological. Varicocele:  None visualized. Pulsed Doppler interrogation of both testes demonstrates normal low resistance arterial and venous waveforms bilaterally. IMPRESSION: *Essentially unremarkable scrotal ultrasound. Electronically Signed   By: Ree Molt M.D.   On: 03/09/2024 11:33    Medications Ordered in ED Medications  0.45 % sodium chloride  infusion ( Intravenous New Bag/Given 03/09/24 1159)  HYDROmorphone  (DILAUDID ) injection 0.5 mg (0.5 mg Intravenous Given 03/09/24 1138)  ketorolac  (TORADOL ) 15 MG/ML injection 15 mg (15 mg Intravenous Given 03/09/24 1319)   Procedures Procedures  (including critical care time) Medical Decision Making / ED Course   Medical Decision Making Amount and/or Complexity of Data Reviewed Labs: ordered. Radiology: ordered.  Risk Prescription drug management.    Right testicular pain Differential  diagnosis considered listed below. Exam not concerning for ovarian torsion. No evidence of urethritis on exam concern for STIs but will send urinalysis.  Will hold on treatment at this time. CBC without leukocytosis or anemia. CMP without significant electrolyte derangements or renal sufficiency.  No elevated bilirubin and no anemia concerning for hemolytic crisis. Retake count normal. Unlikely testicular ischemia from sickle cell disease. Ultrasound negative for epididymitis, orchitis and with good blood flow. UA without evidence of infection.      Final Clinical Impression(s) / ED Diagnoses Final diagnoses:  Pain in right testicle   The patient appears reasonably screened and/or stabilized for discharge and I doubt any other medical condition or other [facility] requiring further screening, evaluation, or treatment in the ED at this time. I have discussed the findings, Dx and Tx plan with the patient/family who expressed understanding and agree(s) with the plan. Discharge instructions discussed at length. The patient/family was given strict return precautions who verbalized understanding of the instructions. No further questions at time of discharge.  Disposition: Discharge  Condition: Good  ED Discharge Orders     None        Follow Up: Primary care provider  Call  to schedule an appointment for close follow up  Roseann Adine PARAS., MD [address] [place] [address] [phone]  Call  as needed  Center for [facility] [address] [place]   [address] [phone] Call  to schedule follow up in 1-2 weeks, as needed     This chart was dictated using voice recognition software.  Despite best efforts to proofread,  errors can occur which can change the documentation meaning.    Trine Raynell Moder, MD 03/09/24 [phone]

## 2024-03-09 NOTE — Discharge Instructions (Addendum)
 For pain control you may take 1000 mg of acetaminophen (Tylenol) every 8 hours and/or 600 mg of Ibuprofen (Motrin, Advil, etc.) every 6-8 hours as needed.  Please limit acetaminophen (Tylenol) to 4000 mg and Ibuprofen (Motrin, Advil, etc.) to 2400 mg for a 24hr period. Please note that other over-the-counter medicine may contain acetaminophen or ibuprofen as a component of their ingredients.

## 2024-03-09 NOTE — ED Notes (Signed)
 Patient is being discharged from the Urgent Care and sent to the Emergency Department via Private Vehicle (Self) . Per Provider GENELL Billy RIGGERS), patient is in need of higher level of care due to Possible Testicular Torsion (US  Needed). Patient is aware and verbalizes understanding of plan of care.  Vitals:   03/09/24 0953  BP: (!) 155/97  Pulse: 94  Resp: 18  Temp: 98.4 F (36.9 C)  SpO2: 96%

## 2024-03-09 NOTE — ED Notes (Signed)
 ED Provider at bedside.

## 2024-03-09 NOTE — ED Triage Notes (Signed)
 My right testicle is big and hurting real bad. First noticed with pain yesterday (with swelling).

## 2024-03-10 LAB — URINE CULTURE: Culture: NO GROWTH

## 2024-03-11 ENCOUNTER — Ambulatory Visit
Admission: RE | Admit: 2024-03-11 | Discharge: 2024-03-11 | Disposition: A | Discharge status: Home / Self Care | Source: Ambulatory Visit

## 2024-03-11 ENCOUNTER — Ambulatory Visit
Admission: RE | Admit: 2024-03-11 | Discharge: 2024-03-11 | Disposition: A | Discharge status: Home / Self Care | Source: Ambulatory Visit | Attending: Physician Assistant | Admitting: Physician Assistant

## 2024-03-11 DIAGNOSIS — Z113 Encounter for screening for infections with a predominantly sexual mode of transmission: Secondary | ICD-10-CM | POA: Diagnosis not present

## 2024-03-11 NOTE — ED Triage Notes (Signed)
 I just want STI testing.

## 2024-03-11 NOTE — ED Provider Notes (Signed)
 [name]    CSN: [id number] Arrival date & time: 03/11/24  1054      History   Chief Complaint Chief Complaint  Patient presents with   SEXUALLY TRANSMITTED DISEASE    Testing    HPI Dustin Franklin is a 43 y.o. male.   Patient presents today for STD screening.  He reports that he was unaware of any screening in the ED but would like anything they did not do. He has not had any penile discharge or genital lesions. He was initially sent to ED for testicular pain. He does not complain of this today.   The history is provided by the patient.    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 No pertinent surgical history.     Home Medications    Prior to Admission medications   Medication Sig Start Date End Date Taking? Authorizing Provider  acetaminophen  (TYLENOL ) 325 MG tablet Take 650 mg by mouth every 6 (six) hours as needed for mild pain.    [provider]  acetaminophen -codeine  (TYLENOL  #3) 300-30 MG tablet Take 1-2 tablets by mouth every 6 (six) hours as needed. 07/05/23   [provider]  albuterol  (VENTOLIN  HFA) 108 (90 Base) MCG/ACT inhaler Inhale 2 puffs into the lungs every 4 (four) hours as needed for wheezing or shortness of breath. 05/15/19 05/19/22  Beverley Leita LABOR, PA-C  amoxicillin  (AMOXIL ) 500 MG tablet Take 500 mg by mouth 2 (two) times daily. 07/05/23   [provider]  amoxicillin -clavulanate (AUGMENTIN ) 875-125 MG tablet Take 1 tablet by mouth every 12 (twelve) hours.    [provider]  aspirin-acetaminophen -caffeine (EXCEDRIN  EXTRA STRENGTH) 250-250-65 MG tablet Take 1 tablet by mouth every 6 (six) hours as needed.    [provider]  chlorhexidine (PERIDEX) 0.12 % solution Use as directed 5 mLs in the mouth or throat 2 (two) times daily. 07/05/23   [provider]  ferrous sulfate 325 (65 FE) MG tablet Take 325 mg by mouth daily with breakfast.    [provider]  folic acid (FOLVITE) 1 MG tablet Take 1 mg by mouth daily.    [provider]  gabapentin (NEURONTIN) 300 MG capsule Take 300 mg by mouth at bedtime.    [provider]  gabapentin (NEURONTIN) 300 MG capsule Take 300 mg by mouth as directed.    [provider]  hydroxyurea (HYDREA) 500 MG capsule Take 500 mg by mouth daily. May take with food to minimize GI side effects.    [provider]  meloxicam (MOBIC) 15 MG tablet Take 15 mg by mouth daily.    [provider]  meloxicam (MOBIC) 15 MG tablet Take 15 mg by mouth daily.    [provider]  Methocarbamol  (ROBAXIN  PO) Take 500 mg by mouth at bedtime as needed.    [provider]  methocarbamol  (ROBAXIN ) 500 MG tablet Take 1 tablet (500 mg total) by mouth at bedtime as needed for muscle spasms. 01/10/23   LampteyAleene KIDD, MD  metroNIDAZOLE  (FLAGYL )  500 MG tablet Take 2,000 mg by mouth as directed.    [provider]    Family History History reviewed. No pertinent family history.  Social History Social History   Tobacco Use   Smoking status: Former    Types: Cigarettes   Smokeless tobacco: Never  Vaping Use   Vaping status: Never Used  Substance Use Topics   Alcohol use: Not Currently    Alcohol/week: 12.0 standard drinks of alcohol    Types: 12 Cans of beer per week   Drug use: Yes    Types: Marijuana     Allergies   Beef-derived drug products, Egg-derived products, Influenza virus vaccine, Iodine, Milk-related compounds, and Peanut-containing drug products   Review of Systems Review of  Systems  Constitutional:  Negative for chills and fever.  Eyes:  Negative for discharge and redness.  Genitourinary:  Negative for dysuria, genital sores and penile discharge.  Neurological:  Negative for numbness.     Physical Exam Triage Vital Signs ED Triage Vitals  Encounter Vitals Group     BP 03/11/24 1116 121/74     Girls Systolic BP Percentile --      Girls Diastolic BP Percentile --      Boys Systolic BP Percentile --      Boys Diastolic BP Percentile --      Pulse Rate 03/11/24 1116 66     Resp --      Temp 03/11/24 1116 98.2 F (36.8 C)     Temp Source 03/11/24 1116 Oral     SpO2 --      Weight 03/11/24 1110 160 lb (72.6 kg)     Height 03/11/24 1110 5' 7 (1.702 m)     Head Circumference --      Peak Flow --      Pain Score 03/11/24 1110 0     Pain Loc --      Pain Education --      Exclude from Growth Chart --    No data found.  Updated Vital Signs BP 121/74 (BP Location: Right Arm)   Pulse 66   Temp 98.2 F (36.8 C) (Oral)   Resp 18   Ht 5' 7 (1.702 m)   Wt 160 lb (72.6 kg)   SpO2 97%   BMI 25.06 kg/m   Visual Acuity Right Eye Distance:   Left Eye Distance:   Bilateral Distance:    Right Eye Near:   Left Eye Near:    Bilateral Near:     Physical Exam Vitals and nursing note reviewed.  Constitutional:      General: He is not in acute distress.    Appearance: Normal appearance. He is not ill-appearing.  HENT:     Head: Normocephalic and atraumatic.  Eyes:     Conjunctiva/sclera: Conjunctivae normal.  Cardiovascular:     Rate and Rhythm: Normal rate.  Pulmonary:     Effort: Pulmonary effort is normal. No respiratory distress.  Neurological:     Mental Status: He is alert.  Psychiatric:        Mood and Affect: Mood normal.        Behavior: Behavior normal.        Thought Content: Thought content normal.      UC Treatments / Results  Labs (all labs ordered are listed, but only abnormal results are displayed) Labs Reviewed  RPR   CYTOLOGY, (ORAL, ANAL, URETHRAL) ANCILLARY ONLY    EKG   Radiology US  SCROTUM W/DOPPLER Result  Date: 03/09/2024 CLINICAL DATA:  testicle pain. EXAM: SCROTAL ULTRASOUND DOPPLER ULTRASOUND OF THE TESTICLES TECHNIQUE: Complete ultrasound examination of the testicles, epididymis, and other scrotal structures was performed. Color and spectral Doppler ultrasound were also utilized to evaluate blood flow to the testicles. COMPARISON:  None Available. FINDINGS: Right testicle Measurements: 2.5 x 3.3 x 4.3 cm. No mass or microlithiasis visualized. Left testicle Measurements: 2.5 x 2.6 x 4.7 cm. No mass or microlithiasis visualized. Right epididymis:  Normal in size and appearance. Left epididymis:  Normal in size and appearance. Hydrocele: Small amount of fluid in the right scrotal sac, likely physiological. Varicocele:  None visualized. Pulsed Doppler interrogation of both testes demonstrates normal low resistance arterial and venous waveforms bilaterally. IMPRESSION: *Essentially unremarkable scrotal ultrasound. Electronically Signed   By: Ree Molt M.D.   On: 03/09/2024 11:33    Procedures Procedures (including critical care time)  Medications Ordered in UC Medications - No data to display  Initial Impression / Assessment and Plan / UC Course  I have reviewed the triage vital signs and the nursing notes.  Pertinent labs & imaging results that were available during my care of the patient were reviewed by me and considered in my medical decision making (see chart for details).    STD screening ordered as requested. Encouraged follow up with any further concerns. Will await results for further recommendation.   Final Clinical Impressions(s) / UC Diagnoses   Final diagnoses:  Screening for STD (sexually transmitted disease)   Discharge Instructions   None    ED Prescriptions   None    PDMP not reviewed this encounter.   Billy Asberry FALCON, PA-C 03/11/24 1126

## 2024-03-12 LAB — CYTOLOGY, (ORAL, ANAL, URETHRAL) ANCILLARY ONLY
Chlamydia: NEGATIVE
Comment: NEGATIVE
Comment: NEGATIVE
Comment: NORMAL
Neisseria Gonorrhea: NEGATIVE
Trichomonas: NEGATIVE

## 2024-03-13 ENCOUNTER — Emergency Department (HOSPITAL_COMMUNITY): Admission: EM | Admit: 2024-03-13 | Discharge: 2024-03-13 | Disposition: A | Discharge status: Home / Self Care

## 2024-03-13 ENCOUNTER — Emergency Department (HOSPITAL_COMMUNITY)

## 2024-03-13 ENCOUNTER — Other Ambulatory Visit: Payer: Self-pay

## 2024-03-13 ENCOUNTER — Encounter (HOSPITAL_COMMUNITY): Payer: Self-pay

## 2024-03-13 DIAGNOSIS — Z91010 Allergy to peanuts: Secondary | ICD-10-CM | POA: Diagnosis not present

## 2024-03-13 DIAGNOSIS — N433 Hydrocele, unspecified: Secondary | ICD-10-CM | POA: Diagnosis not present

## 2024-03-13 DIAGNOSIS — N50811 Right testicular pain: Secondary | ICD-10-CM | POA: Diagnosis present

## 2024-03-13 DIAGNOSIS — N451 Epididymitis: Secondary | ICD-10-CM

## 2024-03-13 DIAGNOSIS — Z7982 Long term (current) use of aspirin: Secondary | ICD-10-CM | POA: Diagnosis not present

## 2024-03-13 DIAGNOSIS — N50819 Testicular pain, unspecified: Secondary | ICD-10-CM | POA: Diagnosis not present

## 2024-03-13 LAB — URINALYSIS, ROUTINE W REFLEX MICROSCOPIC
Bilirubin Urine: NEGATIVE
Glucose, UA: NEGATIVE mg/dL
Hgb urine dipstick: NEGATIVE
Ketones, ur: NEGATIVE mg/dL
Leukocytes,Ua: NEGATIVE
Nitrite: NEGATIVE
Protein, ur: NEGATIVE mg/dL
Specific Gravity, Urine: 1.014 (ref 1.005–1.030)
pH: 8 (ref 5.0–8.0)

## 2024-03-13 LAB — GC/CHLAMYDIA PROBE AMP (~~LOC~~) NOT AT ARMC
Chlamydia: NEGATIVE
Comment: NEGATIVE
Comment: NORMAL
Neisseria Gonorrhea: NEGATIVE

## 2024-03-13 LAB — RPR: RPR Ser Ql: NONREACTIVE

## 2024-03-13 MED ORDER — LEVOFLOXACIN 500 MG PO TABS: 500.0000 mg | ORAL_TABLET | Freq: Every day | ORAL | 0 refills | Status: AC

## 2024-03-13 MED ORDER — FENTANYL CITRATE PF 50 MCG/ML IJ SOSY
25.0000 ug | PREFILLED_SYRINGE | Freq: Once | INTRAMUSCULAR | Status: AC
  Administered 2024-03-13: 25 ug via INTRAVENOUS
  Filled 2024-03-13: qty 1

## 2024-03-13 MED ORDER — LEVOFLOXACIN 500 MG PO TABS
500.0000 mg | ORAL_TABLET | Freq: Once | ORAL | Status: AC
  Administered 2024-03-13: 500 mg via ORAL
  Filled 2024-03-13: qty 1

## 2024-03-13 MED ORDER — HYDROCODONE-ACETAMINOPHEN 5-325 MG PO TABS
2.0000 | ORAL_TABLET | Freq: Once | ORAL | Status: DC
  Filled 2024-03-13: qty 2

## 2024-03-13 MED ORDER — KETOROLAC TROMETHAMINE 15 MG/ML IJ SOLN
15.0000 mg | Freq: Once | INTRAMUSCULAR | Status: AC
Start: 2024-03-13 — End: 2024-03-13
  Administered 2024-03-13: 15 mg via INTRAVENOUS
  Filled 2024-03-13: qty 1

## 2024-03-13 MED ORDER — ONDANSETRON 4 MG PO TBDP
4.0000 mg | ORAL_TABLET | Freq: Three times a day (TID) | ORAL | 0 refills | Status: DC | PRN
Start: 2024-03-13 — End: 2024-03-13

## 2024-03-13 MED ORDER — OXYCODONE HCL 5 MG PO TABS: 5.0000 mg | ORAL_TABLET | Freq: Four times a day (QID) | ORAL | 0 refills | Status: AC | PRN

## 2024-03-13 MED ORDER — HYDROMORPHONE HCL 1 MG/ML IJ SOLN
0.5000 mg | Freq: Once | INTRAMUSCULAR | Status: AC
  Administered 2024-03-13: 0.5 mg via INTRAVENOUS
  Filled 2024-03-13: qty 1

## 2024-03-13 MED ORDER — ONDANSETRON 4 MG PO TBDP: 4.0000 mg | ORAL_TABLET | Freq: Three times a day (TID) | ORAL | 0 refills | Status: AC | PRN

## 2024-03-13 MED ORDER — ONDANSETRON HCL 4 MG/2ML IJ SOLN
4.0000 mg | Freq: Once | INTRAMUSCULAR | Status: AC
  Administered 2024-03-13: 4 mg via INTRAVENOUS
  Filled 2024-03-13: qty 2

## 2024-03-13 MED ORDER — LEVOFLOXACIN 500 MG PO TABS: 500.0000 mg | ORAL_TABLET | Freq: Every day | ORAL | 0 refills | Status: DC

## 2024-03-13 MED ORDER — ONDANSETRON 4 MG PO TBDP: 4.0000 mg | ORAL_TABLET | Freq: Once | ORAL | Status: DC

## 2024-03-13 NOTE — ED Provider Notes (Signed)
 Care assumed from Ward, PA-C at shift change. Please see their note for further information.   Briefly: Patient here with right testicle swelling for the past 4 days. Was initially seen on 8/01, had normal work-up and was discharged home. Returns today for worsening swelling and pain.   Plan: Awaiting scrotum US  and UA to determine dispo  Patients US  has resulted and reveals  1. Negative for testicular torsion or intratesticular mass. 2. Interval enlarged heterogeneous hypervascular right epididymis suspect for epididymitis. Small right-sided hydrocele.  I have personally reviewed and interpreted this imaging and agree with radiology interpretation.  Patient's UA is unremarkable, chart reviewed and he has had negative GC/chlamydia testing both on 8/1 and 8/3.  Given this, will treat for likely cause of enteric organisms with levofloxacin .  Given first dose in the ER today.  Will give urology referral for follow-up as well. Will give a few doses of oxycodone  for pain control.  PDMP reviewed.  Patient advised not to drive or operate machinery while taking this medicine. Evaluation and diagnostic testing in the emergency department does not suggest an emergent condition requiring admission or immediate intervention beyond what has been performed at this time.  Plan for discharge with close PCP follow-up.  Patient is understanding and amenable with plan, educated on red flag symptoms that would prompt immediate return.  Patient discharged in stable condition.     Ellan Tess A, PA-C 03/13/24 1916    Ruthe Cornet, DO 03/13/24 1931

## 2024-03-13 NOTE — ED Triage Notes (Addendum)
 Pt arrives POV c/o testicle and groin pain that started 3 days ago. Pt was seen at [facility] for same with full workup done but states the pain is worse today

## 2024-03-13 NOTE — ED Provider Notes (Signed)
 [place] EMERGENCY DEPARTMENT AT [facility] Provider Note   CSN: [id number] Arrival date & time: 03/13/24  1111     Patient presents with: Testicle Pain   Dustin Franklin is a 43 y.o. male.   43 year old male presents today for testicular swelling and pain.  He was previously seen for this on August 1 in the emergency department.  He had a reassuring workup.  Since then his symptoms have worsened.  The swelling has worsened on the right testicle.  The history is provided by the patient. No language interpreter was used.       Prior to Admission medications   Medication Sig Start Date End Date Taking? Authorizing Provider  acetaminophen  (TYLENOL ) 325 MG tablet Take 650 mg by mouth every 6 (six) hours as needed for mild pain.    [provider]  acetaminophen -codeine  (TYLENOL  #3) 300-30 MG tablet Take 1-2 tablets by mouth every 6 (six) hours as needed. 07/05/23   [provider]  albuterol  (VENTOLIN  HFA) 108 (90 Base) MCG/ACT inhaler Inhale 2 puffs into the lungs every 4 (four) hours as needed for wheezing or shortness of breath. 05/15/19 05/19/22  Beverley Leita LABOR, PA-C  amoxicillin  (AMOXIL ) 500 MG tablet Take 500 mg by mouth 2 (two) times daily. 07/05/23   [provider]  amoxicillin -clavulanate (AUGMENTIN ) 875-125 MG tablet Take 1 tablet by mouth every 12 (twelve) hours.    [provider]  aspirin-acetaminophen -caffeine (EXCEDRIN EXTRA STRENGTH) 250-250-65 MG tablet Take 1 tablet by mouth every 6 (six) hours as needed.    [provider]  chlorhexidine (PERIDEX) 0.12 % solution Use as directed 5 mLs in the mouth or throat 2 (two) times daily. 07/05/23   [provider]  ferrous sulfate 325 (65 FE) MG tablet Take 325 mg by mouth daily with breakfast.    [provider]  folic acid (FOLVITE) 1 MG tablet Take 1 mg by mouth daily.    [provider]  gabapentin (NEURONTIN) 300 MG capsule Take 300 mg  by mouth at bedtime.    [provider]  gabapentin (NEURONTIN) 300 MG capsule Take 300 mg by mouth as directed.    [provider]  hydroxyurea (HYDREA) 500 MG capsule Take 500 mg by mouth daily. May take with food to minimize GI side effects.    [provider]  meloxicam (MOBIC) 15 MG tablet Take 15 mg by mouth daily.    [provider]  meloxicam (MOBIC) 15 MG tablet Take 15 mg by mouth daily.    [provider]  Methocarbamol  (ROBAXIN  PO) Take 500 mg by mouth at bedtime as needed.    [provider]  methocarbamol  (ROBAXIN ) 500 MG tablet Take 1 tablet (500 mg total) by mouth at bedtime as needed for muscle spasms. 01/10/23   Blaise Aleene KIDD, MD  metroNIDAZOLE  (FLAGYL ) 500 MG tablet Take 2,000 mg by mouth as directed.    [provider]    Allergies: Beef-derived drug products, Egg-derived products, Influenza virus vaccine, Iodine, Milk-related compounds, and Peanut-containing drug products    Review of Systems  Constitutional:  Negative for chills and fever.  Genitourinary:  Positive for scrotal swelling and testicular pain.  All other systems reviewed and are negative.   Updated Vital Signs BP 126/75 (BP Location: Right Arm)   Pulse 68   Temp 98.3 F (36.8 C)   Resp 16   Ht 5' 7 (1.702 m)   Wt 72.6 kg   SpO2 99%  BMI 25.06 kg/m   Physical Exam Vitals and nursing note reviewed.  Constitutional:      General: He is not in acute distress.    Appearance: Normal appearance. He is not ill-appearing.  HENT:     Head: Normocephalic and atraumatic.     Nose: Nose normal.  Eyes:     Conjunctiva/sclera: Conjunctivae normal.  Pulmonary:     Effort: Pulmonary effort is normal. No respiratory distress.  Genitourinary:    Comments: Nurse tech at bedside.  Right testicle is swollen.  Cremasteric reflex intact. Musculoskeletal:        General: No deformity.  Skin:    Findings: No rash.  Neurological:     Mental  Status: He is alert.     (all labs ordered are listed, but only abnormal results are displayed) Labs Reviewed - No data to display  EKG: None  Radiology: No results found.   Procedures   Medications Ordered in the ED - No data to display                                  Medical Decision Making Amount and/or Complexity of Data Reviewed Labs: ordered. Radiology: ordered.  Risk Prescription drug management.   Medical Decision Making / ED Course   This patient presents to the ED for concern of testicular swelling, this involves an extensive number of treatment options, and is a complaint that carries with it a high risk of complications and morbidity.  The differential diagnosis includes epididymitis, orchitis, testicular torsion  MDM: 43 year old male presents today for concern of testicular pain and swelling. On exam there is testicular swelling and tenderness. Testicular ultrasound ordered.  Pain control given.  Ultrasound pending at the end of my shift.  Signed out to oncoming provider.   Lab Tests: -I ordered, reviewed, and interpreted labs.   The pertinent results include:   Labs Reviewed - No data to display    EKG  EKG Interpretation Date/Time:    Ventricular Rate:    PR Interval:    QRS Duration:    QT Interval:    QTC Calculation:   R Axis:      Text Interpretation:           Imaging Studies ordered: I ordered imaging studies including testicular ultrasound I independently visualized and interpreted imaging. I agree with the radiologist interpretation   Medicines ordered and prescription drug management: No orders of the defined types were placed in this encounter.   -I have reviewed the patients home medicines and have made adjustments as needed     Reevaluation: After the interventions noted above, I reevaluated the patient and found that they have :stayed the same  Co morbidities that complicate the patient evaluation  Past  Medical History:  Diagnosis Date   Bronchitis    Hx of bone transplant    for sickle cell   Pancreatitis    Sickle cell anemia (HCC)       Dispostion: Findings reviewed with provider at the end of my shift  Final diagnoses:  Right testicular pain    ED Discharge Orders     None          Hildegard Loge, PA-C 03/13/24 1527    Ula Prentice SAUNDERS, MD 03/13/24 1555

## 2024-03-13 NOTE — ED Notes (Signed)
 Patient transported to [place]

## 2024-03-13 NOTE — Discharge Instructions (Addendum)
 As we discussed, the ultrasound of your testicle revealed that you have epididymitis which is likely the cause of your pain.  I have given you a prescription for Levaquin  which is an antibiotic you need to take as prescribed in its entirety for management of this.  Is also important that you follow-up with urology.  I have given you a referral with a number to call to schedule an appointment.  Please call at your earliest convenience.  In the interim, I have given you a prescription for oxycodone  which is a narcotic pain medicine you can take as prescribed as needed for severe breakthrough pain only.  Do not drive or operate heavy machinery while taking this medication as it can be sedating.  Please use acetaminophen  (Tylenol ) or ibuprofen  (Advil , Motrin ) for pain.  You may use 800 mg ibuprofen  every 6 hours or 1000 mg of acetaminophen  every 6 hours.  You may choose to alternate between the two, this would be most effective. Do not exceed 4000 mg of acetaminophen  within 24 hours.  Do not exceed 3200 mg ibuprofen  within 24 hours.  You can also try an ice pack, please be sure that you place a towel or some other barrier between the ice and your testicle to prevent worsening injury.  I also recommend that you wear compressive underwear to help hold to your testicle and in place which should reduce your pain.  Return if development of any new or worsening symptoms.

## 2024-03-13 NOTE — ED Notes (Signed)
 PT refused hydrocodone .. says he needs something stronger. EDPA advised

## 2024-03-16 DIAGNOSIS — D571 Sickle-cell disease without crisis: Secondary | ICD-10-CM | POA: Diagnosis not present

## 2024-03-16 DIAGNOSIS — N451 Epididymitis: Secondary | ICD-10-CM | POA: Diagnosis not present

## 2024-03-16 DIAGNOSIS — N452 Orchitis: Secondary | ICD-10-CM | POA: Diagnosis not present

## 2024-07-28 ENCOUNTER — Ambulatory Visit: Admission: EM | Admit: 2024-07-28 | Discharge: 2024-07-28 | Disposition: A | Discharge status: Home / Self Care
